# Patient Record
Sex: Female | Born: 1965 | Hispanic: No | Smoking: Never smoker
Health system: Southern US, Community
[De-identification: ages and names within clinical notes are randomized; demographics above are authoritative.]

## PROBLEM LIST (undated history)

## (undated) DIAGNOSIS — M1990 Unspecified osteoarthritis, unspecified site: Secondary | ICD-10-CM

## (undated) DIAGNOSIS — K219 Gastro-esophageal reflux disease without esophagitis: Secondary | ICD-10-CM

## (undated) DIAGNOSIS — Z8489 Family history of other specified conditions: Secondary | ICD-10-CM

## (undated) DIAGNOSIS — I10 Essential (primary) hypertension: Secondary | ICD-10-CM

## (undated) DIAGNOSIS — E119 Type 2 diabetes mellitus without complications: Secondary | ICD-10-CM

## (undated) DIAGNOSIS — G43909 Migraine, unspecified, not intractable, without status migrainosus: Secondary | ICD-10-CM

## (undated) DIAGNOSIS — R0789 Other chest pain: Secondary | ICD-10-CM

## (undated) DIAGNOSIS — S7400XA Injury of sciatic nerve at hip and thigh level, unspecified leg, initial encounter: Secondary | ICD-10-CM

## (undated) DIAGNOSIS — K8020 Calculus of gallbladder without cholecystitis without obstruction: Secondary | ICD-10-CM

## (undated) DIAGNOSIS — T7840XA Allergy, unspecified, initial encounter: Secondary | ICD-10-CM

## (undated) HISTORY — DX: Allergy, unspecified, initial encounter: T78.40XA

## (undated) HISTORY — DX: Type 2 diabetes mellitus without complications: E11.9

## (undated) HISTORY — PX: TONSILLECTOMY: SUR1361

## (undated) HISTORY — DX: Gastro-esophageal reflux disease without esophagitis: K21.9

## (undated) HISTORY — DX: Unspecified osteoarthritis, unspecified site: M19.90

---
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
## 1994-12-26 HISTORY — PX: ANTERIOR CRUCIATE LIGAMENT REPAIR: SHX115

## 1996-12-26 HISTORY — PX: SINOSCOPY: SHX187

## 1996-12-26 HISTORY — PX: KNEE ARTHROSCOPY: SUR90

## 1998-10-20 ENCOUNTER — Encounter: Payer: Self-pay | Admitting: Orthopedic Surgery

## 1998-10-21 ENCOUNTER — Observation Stay (HOSPITAL_COMMUNITY): Admission: AD | Admit: 1998-10-21 | Discharge: 1998-10-22 | Payer: Self-pay | Admitting: Orthopedic Surgery

## 1998-11-03 ENCOUNTER — Other Ambulatory Visit: Admission: RE | Admit: 1998-11-03 | Discharge: 1998-11-03 | Payer: Self-pay | Admitting: Obstetrics and Gynecology

## 1999-11-10 ENCOUNTER — Emergency Department (HOSPITAL_COMMUNITY): Admission: EM | Admit: 1999-11-10 | Discharge: 1999-11-10 | Payer: Self-pay | Admitting: Emergency Medicine

## 1999-11-10 ENCOUNTER — Encounter: Payer: Self-pay | Admitting: Emergency Medicine

## 2000-11-15 ENCOUNTER — Other Ambulatory Visit: Admission: RE | Admit: 2000-11-15 | Discharge: 2000-11-15 | Payer: Self-pay | Admitting: Obstetrics and Gynecology

## 2001-01-07 ENCOUNTER — Emergency Department (HOSPITAL_COMMUNITY): Admission: EM | Admit: 2001-01-07 | Discharge: 2001-01-07 | Payer: Self-pay

## 2001-02-21 ENCOUNTER — Other Ambulatory Visit: Admission: RE | Admit: 2001-02-21 | Discharge: 2001-02-21 | Payer: Self-pay | Admitting: Obstetrics and Gynecology

## 2001-03-08 ENCOUNTER — Ambulatory Visit (HOSPITAL_COMMUNITY): Admission: RE | Admit: 2001-03-08 | Discharge: 2001-03-08 | Payer: Self-pay | Admitting: Obstetrics and Gynecology

## 2001-03-08 ENCOUNTER — Encounter (INDEPENDENT_AMBULATORY_CARE_PROVIDER_SITE_OTHER): Payer: Self-pay

## 2001-04-03 ENCOUNTER — Other Ambulatory Visit: Admission: RE | Admit: 2001-04-03 | Discharge: 2001-04-03 | Payer: Self-pay | Admitting: Obstetrics and Gynecology

## 2001-04-03 ENCOUNTER — Encounter (INDEPENDENT_AMBULATORY_CARE_PROVIDER_SITE_OTHER): Payer: Self-pay | Admitting: Specialist

## 2002-04-21 ENCOUNTER — Emergency Department (HOSPITAL_COMMUNITY): Admission: EM | Admit: 2002-04-21 | Discharge: 2002-04-21 | Payer: Self-pay

## 2002-06-11 ENCOUNTER — Other Ambulatory Visit: Admission: RE | Admit: 2002-06-11 | Discharge: 2002-06-11 | Payer: Self-pay | Admitting: Obstetrics and Gynecology

## 2002-09-16 ENCOUNTER — Ambulatory Visit (HOSPITAL_COMMUNITY): Admission: RE | Admit: 2002-09-16 | Discharge: 2002-09-16 | Payer: Self-pay | Admitting: Obstetrics and Gynecology

## 2002-09-16 ENCOUNTER — Encounter (INDEPENDENT_AMBULATORY_CARE_PROVIDER_SITE_OTHER): Payer: Self-pay | Admitting: Specialist

## 2003-11-05 ENCOUNTER — Other Ambulatory Visit: Admission: RE | Admit: 2003-11-05 | Discharge: 2003-11-05 | Payer: Self-pay | Admitting: Obstetrics and Gynecology

## 2003-12-17 ENCOUNTER — Other Ambulatory Visit: Admission: RE | Admit: 2003-12-17 | Discharge: 2003-12-17 | Payer: Self-pay | Admitting: Obstetrics and Gynecology

## 2008-08-31 ENCOUNTER — Emergency Department (HOSPITAL_COMMUNITY): Admission: EM | Admit: 2008-08-31 | Discharge: 2008-08-31 | Payer: Self-pay | Admitting: Emergency Medicine

## 2008-09-10 ENCOUNTER — Ambulatory Visit (HOSPITAL_COMMUNITY): Admission: RE | Admit: 2008-09-10 | Discharge: 2008-09-10 | Payer: Self-pay | Admitting: Gastroenterology

## 2008-09-30 ENCOUNTER — Encounter: Admission: RE | Admit: 2008-09-30 | Discharge: 2008-09-30 | Payer: Self-pay | Admitting: Gastroenterology

## 2011-05-13 NOTE — Op Note (Signed)
[facility]  Patient:    [name], [name]                        MRN: [id number] Proc. Date: 03/08/01 Adm. Date:  [id number] Disc. Date: [id number] Attending:  Earline Mayotte R                           Operative Report  PREOPERATIVE DIAGNOSIS:       1. Pelvic pain.                               2. Infertility.  POSTOPERATIVE DIAGNOSIS:      1. Pelvic pain.                               2. Infertility.                               3. Probable endometriosis.  OPERATION:                    1. Diagnostic laparoscopy.                               2. Chromopertubation.                               3. Peritoneal biopsies.                               4. Resection of endometriosis.  SURGEON:                      Vanessa P. Pennie Rushing, M.D.  ANESTHESIA:                   General orotracheal.  ESTIMATED BLOOD LOSS:         Less than 50 cc.  COMPLICATIONS:                None.  FINDINGS:                     The uterus, tubes, and ovaries were within normal limits without adhesions.  The tubes were opened bilaterally at chromopertubation.  In the posterior cul-de-sac on the left uterosacral ligament, there was a stellate lesion consistent with endometriosis.  There was a stellate lesion on the right uterosacral ligament and right pelvic sidewall also consistent with endometriosis.  The appendix appeared normal.  DESCRIPTION OF PROCEDURE:     The patient was taken to the operating room after appropriate identification and placed on the operating table.  After the attaining adequate general anesthesia, she was placed in the modified lithotomy position.  The abdomen, perineum, and vagina were prepped with multiple layers of Betadine.  A Foley catheter was inserted into the bladder and connected to straight drainage.  A single-tooth tenaculum was placed on the anterior lip of the cervix and an acorn cannula placed into the cervix. The abdomen was draped as a  sterile field.  The subumbilical and suprapubic regions were infiltrated with a total of 10 cc  of 0.25% Marcaine.  A subumbilical incision was made and a Veress cannula placed through that incision into the peritoneal cavity.  A pneumoperitoneum was created with 4 liters of CO2.  The Veress cannula was removed and a laparoscopic trocar placed through that incision into the perineal cavity.  The laparoscope was placed through the trocar sleeve.  A suprapubic incision was made to the left of midline,  and laparoscopic probe trocar placed through that incision into the peritoneal cavity under direct visualization.  The above-noted findings were made and documented.  Indigo carmine dye was used to inject the endometrial cavity, and free spillage from both fallopian tubes was noted.  The area consistent with endometriosis on the left uterosacral ligament was then biopsy excised with the aid of hydrodissection and removed from the operative field via the laparoscope port.  A similar procedure was carried out with the area consistent with endometriosis on the right pelvic sidewall and uterosacral ligament, and these were all sent as specimens to rule out endometriosis. Copious irrigation was carried out.  Cautery was used to achieve adequate hemostasis, and approximately 100 cc of warm lactated Ringers was left in the peritoneal cavity.  All instruments were then removed from the peritoneal cavity under direct visualization as the CO2 was allowed to escape.  The subumbilical and suprapubic incisions were closed with subcuticular sutures of 3-0 Vicryl. Sterile dressing were applied.  All instruments were removed from the vagina and the Foley catheter removed.  The patient was taken from the operating room to the recovery room in satisfactory condition having tolerated the procedure well with sponge and instrument counts correct.  Specimens to pathology: Peritoneal excisional biopsies to rule  out endometriosis. DD:  03/08/01 TD:  03/08/01 Job: [id number]

## 2011-05-13 NOTE — Op Note (Signed)
   [name], [name]                           ACCOUNT NO.:  [id number]   MEDICAL RECORD NO.:  [id number]                   PATIENT TYPE:  AMB   LOCATION:  [facility]                                  FACILITY:  [facility]   PHYSICIAN:  Hal Morales, M.D.             DATE OF BIRTH:  [birth year]   DATE OF PROCEDURE:  09/16/2002  DATE OF DISCHARGE:                                 OPERATIVE REPORT   PREOPERATIVE DIAGNOSES:  Endocervical high grade squamous intraepithelial  lesion.   POSTOPERATIVE DIAGNOSES:  Endocervical high grade squamous intraepithelial  lesion.   OPERATION:  Cold knife conization of the cervix.   ANESTHESIA:  General.   ESTIMATED BLOOD LOSS:  Less than 50 cc.   COMPLICATIONS:  None.   FINDINGS:  The patient had no significant Lugol's nonstaining areas.  She  had undergone colposcopically directed biopsies in the past with CIN I at 3  o'clock and high grade SIL on endocervical curettings.   PROCEDURE:  The patient was taken to the operating room after appropriate  identification and placed on the operating table.  After the induction of  general anesthesia she was placed in the lithotomy position and the perineum  and vagina were prepped with multiple layers of Betadine and draped as a  sterile field.  A red Robinson catheter was used to empty the bladder.  A  single tooth tenaculum was placed on the anterior cervix outside the  transition zone and the cervix injected with a dilute solution of Pitressin.  The cervix was then painted with Lugol stain and anchor sutures placed at  the 3 and 9 o'clock positions with 0 Vicryl and held.  A cone shaped  specimen including the endocervix was then removed and sent for fresh  evaluation and pathology.  Endocervical curettings were obtained which were  minimal.  A Sturmdorf suture was placed at the 12 and 6 o'clock position and  cautery used to allow for adequate hemostasis.  A gel foam pad was placed in  the conization bed.   Hemostasis was noted to be adequate and all instruments  were removed from the vagina as the patient was awakened from general  anesthesia and taken to the recovery room in satisfactory condition having  tolerated the procedure well with sponge and instrument counts correct.   SPECIMENS:  Conization of the cervix and endocervical curettings.                                               Hal Morales, M.D.    VPH/MEDQ  D:  09/16/2002  T:  09/16/2002  Job:  [id number]

## 2011-05-13 NOTE — H&P (Signed)
[facility]  PatientTERRILYN, [name]                          MRN: [id number] Adm. Date:  03/08/01 Attending:  Erie Noe P. Pennie Summers, M.D.                         History and Physical  DATE OF BIRTH:                August 13, 1966.  HISTORY OF PRESENT ILLNESS:   The patient is a 45 year old black single female, para 1-0-1-1, who presents for evaluation of pelvic pain and infertility.  The patient was initially seen in our office on November 15, 2000 complaining of two years of increasingly severe premenstrual symptomatology including headache, nausea, and back pain two weeks prior to her menses.  She also had not used any birth control over the previous year and had not become pregnant.  She had an episode of low grade fever, severe abdominal pain approximately two weeks subsequent to that and a urinalysis which was suggestive of pyelonephritis.  The patient had had GC and chlamydia cultures on November 15, 2000 which were negative.  Urine culture showed 30,000 colonies of enterococcus.  The patient was treated with Rocephin and had resolution of her symptoms over one to two weeks.  Since that time, the patient has also begun to have some mild menstrual irregularity where previously she had had normal menses.  She has had a normal PSH, a prolactin which was slightly elevated at 30.5, and a progesterone which appeared ovulatory on day 21 at 18.9.  PAST OBSTETRIC HISTORY:       The patient had one full-term pregnancy 17 years ago.  Conceived without difficulty.  GYNECOLOGIC HISTORY:          She underwent menarche at age 86 with monthly menses which consisted of two days of heavy bleeding and three days of tapering and spotting.  She denies intermenstrual bleeding but has had severe cramps that start with the onset of her menses and cause nausea.  She has had some mood swings and headaches prior to her menses.  She is currently sexually active having  had two partners in her life time.  Her last Pap smear done November 15, 2000 showed ASCUS and one was repeated on February 21, 2001.  She has had a history of yeast infections but denies GC, chlamydia, or any sexually transmitted infections.  She is currently using no birth control as she is trying to achieve a pregnancy and has had only used birth control pills in the past with no IUD history.  PAST MEDICAL HISTORY:         Frequent headaches.  PAST SURGICAL HISTORY:        Third molar extraction and knee surgery.  CURRENT MEDICATIONS:          None.  ALLERGIES:                    TYLOX and TORADOL cause itching.  HABITS:                       Diet regular.  Exercise regular.  Monthly self-breast examination.  No alcohol, smoking, or recreational drug use though the patient is exposed to second-hand smoke.  FAMILY HISTORY:  Positive for varicose veins, cancer, hypertension, and migraines.  REVIEW OF SYSTEMS:            Negative except as mentioned above.  PHYSICAL EXAMINATION:  VITAL SIGNS:                  Blood pressure is 120/80.  Weight is 158 pounds.  LUNGS:                        Clear.  HEART:                        Regular rate and rhythm.  ABDOMEN:                      Soft without masses or organomegaly.  EXTREMITIES:                  No clubbing, cyanosis, or edema.  PELVIC:                       EGBUS within normal limits.  The vagina is rugae.  The cervix is without gross lesions.  The uterus feels upper limits of normal size, mobile, and nontender.  Adnexa no masses.  Rectovaginal no masses.  LABORATORY DATA:              Pelvic ultrasound performed on December 06, 2000 showed a normal size uterus with a normal right ovary.  The left ovary was not seen.  IMPRESSION:                   1. Pelvic pain, cannot completely rule out                                  chronic pelvic inflammatory disease with a                                  recent  exacerbation.                               2. Infertility.                               3. Irregular menses.  DISPOSITION:                  A discussion is held with the patient concerning indications for her laparoscopy as well as the risks involved which include but are not limited to anesthesia, bleeding, infection, damage to adjacent organs.  The patient wishes to proceed with diagnostic laparoscopy and possible chromopertubation.  She will have prophylactic antibiotics prior to her procedure. DD:  02/21/01 TD:  02/21/01 Job: [id number]

## 2011-09-28 LAB — CBC
HCT: 37.4
Hemoglobin: 12.6
Platelets: 205
RBC: 4.17
WBC: 9.8

## 2011-09-28 LAB — COMPREHENSIVE METABOLIC PANEL
Albumin: 3.8
Alkaline Phosphatase: 61
BUN: 14
CO2: 28
Chloride: 106
Glucose, Bld: 106 — ABNORMAL HIGH
Potassium: 4
Total Bilirubin: 0.9

## 2011-09-28 LAB — URINALYSIS, ROUTINE W REFLEX MICROSCOPIC
Glucose, UA: NEGATIVE
Hgb urine dipstick: NEGATIVE
Ketones, ur: NEGATIVE
Leukocytes, UA: NEGATIVE
Protein, ur: NEGATIVE

## 2011-09-28 LAB — DIFFERENTIAL
Basophils Absolute: 0.1
Eosinophils Absolute: 0.2
Lymphocytes Relative: 33
Neutro Abs: 5.4

## 2014-02-24 ENCOUNTER — Other Ambulatory Visit: Payer: Self-pay | Admitting: Internal Medicine

## 2014-02-24 DIAGNOSIS — R1013 Epigastric pain: Secondary | ICD-10-CM

## 2014-02-27 ENCOUNTER — Ambulatory Visit
Admission: RE | Admit: 2014-02-27 | Discharge: 2014-02-27 | Disposition: A | Discharge status: Home / Self Care | Payer: BC Managed Care – PPO | Source: Ambulatory Visit | Attending: Internal Medicine | Admitting: Internal Medicine

## 2014-02-27 DIAGNOSIS — R1013 Epigastric pain: Secondary | ICD-10-CM

## 2014-08-30 ENCOUNTER — Encounter (HOSPITAL_COMMUNITY): Payer: Self-pay | Admitting: Emergency Medicine

## 2014-08-30 DIAGNOSIS — Z885 Allergy status to narcotic agent status: Secondary | ICD-10-CM | POA: Diagnosis not present

## 2014-08-30 DIAGNOSIS — IMO0002 Reserved for concepts with insufficient information to code with codable children: Secondary | ICD-10-CM | POA: Diagnosis not present

## 2014-08-30 DIAGNOSIS — K8010 Calculus of gallbladder with chronic cholecystitis without obstruction: Secondary | ICD-10-CM | POA: Diagnosis not present

## 2014-08-30 DIAGNOSIS — K219 Gastro-esophageal reflux disease without esophagitis: Secondary | ICD-10-CM | POA: Insufficient documentation

## 2014-08-30 DIAGNOSIS — J301 Allergic rhinitis due to pollen: Secondary | ICD-10-CM | POA: Diagnosis not present

## 2014-08-30 DIAGNOSIS — Z91018 Allergy to other foods: Secondary | ICD-10-CM | POA: Insufficient documentation

## 2014-08-30 DIAGNOSIS — K7689 Other specified diseases of liver: Secondary | ICD-10-CM | POA: Insufficient documentation

## 2014-08-30 DIAGNOSIS — Z79899 Other long term (current) drug therapy: Secondary | ICD-10-CM | POA: Diagnosis not present

## 2014-08-30 DIAGNOSIS — Z880 Allergy status to penicillin: Secondary | ICD-10-CM | POA: Insufficient documentation

## 2014-08-30 DIAGNOSIS — K660 Peritoneal adhesions (postprocedural) (postinfection): Secondary | ICD-10-CM | POA: Diagnosis not present

## 2014-08-30 DIAGNOSIS — R1011 Right upper quadrant pain: Secondary | ICD-10-CM | POA: Diagnosis present

## 2014-08-30 LAB — COMPREHENSIVE METABOLIC PANEL
ALBUMIN: 4.3 g/dL (ref 3.5–5.2)
ALK PHOS: 197 U/L — AB (ref 39–117)
ALT: 530 U/L — ABNORMAL HIGH (ref 0–35)
ANION GAP: 18 — AB (ref 5–15)
AST: 561 U/L — AB (ref 0–37)
BILIRUBIN TOTAL: 1 mg/dL (ref 0.3–1.2)
BUN: 11 mg/dL (ref 6–23)
CHLORIDE: 95 meq/L — AB (ref 96–112)
CO2: 23 mEq/L (ref 19–32)
Calcium: 9.9 mg/dL (ref 8.4–10.5)
Creatinine, Ser: 0.92 mg/dL (ref 0.50–1.10)
GFR calc Af Amer: 83 mL/min — ABNORMAL LOW (ref >90)
GFR calc non Af Amer: 72 mL/min — ABNORMAL LOW (ref >90)
Glucose, Bld: 135 mg/dL — ABNORMAL HIGH (ref 70–99)
POTASSIUM: 3.9 meq/L (ref 3.7–5.3)
SODIUM: 136 meq/L — AB (ref 137–147)
TOTAL PROTEIN: 8.4 g/dL — AB (ref 6.0–8.3)

## 2014-08-30 LAB — URINALYSIS, ROUTINE W REFLEX MICROSCOPIC
Bilirubin Urine: NEGATIVE
GLUCOSE, UA: NEGATIVE mg/dL
Hgb urine dipstick: NEGATIVE
Ketones, ur: NEGATIVE mg/dL
LEUKOCYTES UA: NEGATIVE
Nitrite: NEGATIVE
PROTEIN: NEGATIVE mg/dL
SPECIFIC GRAVITY, URINE: 1.007 (ref 1.005–1.030)
UROBILINOGEN UA: 0.2 mg/dL (ref 0.0–1.0)
pH: 6 (ref 5.0–8.0)

## 2014-08-30 LAB — POC URINE PREG, ED: Preg Test, Ur: NEGATIVE

## 2014-08-30 LAB — LIPASE, BLOOD: Lipase: 24 U/L (ref 11–59)

## 2014-08-30 NOTE — ED Notes (Signed)
Patient presents stating she has been diagnosed with gallstones.  Has been having N/V today

## 2014-08-31 ENCOUNTER — Emergency Department (HOSPITAL_COMMUNITY): Payer: BC Managed Care – PPO

## 2014-08-31 ENCOUNTER — Observation Stay (HOSPITAL_COMMUNITY): Payer: BC Managed Care – PPO

## 2014-08-31 ENCOUNTER — Encounter (HOSPITAL_COMMUNITY): Payer: BC Managed Care – PPO | Admitting: Anesthesiology

## 2014-08-31 ENCOUNTER — Observation Stay (HOSPITAL_COMMUNITY)
Admission: EM | Admit: 2014-08-31 | Discharge: 2014-09-04 | Disposition: A | Discharge status: Home / Self Care | Payer: BC Managed Care – PPO | Attending: General Surgery | Admitting: General Surgery

## 2014-08-31 ENCOUNTER — Encounter (HOSPITAL_COMMUNITY)
Admission: EM | Disposition: A | Discharge status: Home / Self Care | Payer: Self-pay | Source: Home / Self Care | Attending: Emergency Medicine

## 2014-08-31 ENCOUNTER — Observation Stay (HOSPITAL_COMMUNITY): Payer: BC Managed Care – PPO | Admitting: Anesthesiology

## 2014-08-31 DIAGNOSIS — K8000 Calculus of gallbladder with acute cholecystitis without obstruction: Secondary | ICD-10-CM | POA: Diagnosis present

## 2014-08-31 DIAGNOSIS — K8001 Calculus of gallbladder with acute cholecystitis with obstruction: Secondary | ICD-10-CM

## 2014-08-31 HISTORY — PX: CHOLECYSTECTOMY: SHX55

## 2014-08-31 HISTORY — DX: Family history of other specified conditions: Z84.89

## 2014-08-31 HISTORY — DX: Migraine, unspecified, not intractable, without status migrainosus: G43.909

## 2014-08-31 HISTORY — PX: LAPAROSCOPIC CHOLECYSTECTOMY: SUR755

## 2014-08-31 HISTORY — DX: Calculus of gallbladder without cholecystitis without obstruction: K80.20

## 2014-08-31 HISTORY — DX: Injury of sciatic nerve at hip and thigh level, unspecified leg, initial encounter: S74.00XA

## 2014-08-31 LAB — CBC WITH DIFFERENTIAL/PLATELET
BASOS ABS: 0 10*3/uL (ref 0.0–0.1)
Basophils Relative: 0 % (ref 0–1)
EOS ABS: 0 10*3/uL (ref 0.0–0.7)
Eosinophils Relative: 0 % (ref 0–5)
HCT: 43 % (ref 36.0–46.0)
HEMOGLOBIN: 15.1 g/dL — AB (ref 12.0–15.0)
LYMPHS PCT: 8 % — AB (ref 12–46)
Lymphs Abs: 0.9 10*3/uL (ref 0.7–4.0)
MCH: 29.8 pg (ref 26.0–34.0)
MCHC: 35.1 g/dL (ref 30.0–36.0)
MCV: 85 fL (ref 78.0–100.0)
MONO ABS: 0.7 10*3/uL (ref 0.1–1.0)
Monocytes Relative: 6 % (ref 3–12)
NEUTROS PCT: 86 % — AB (ref 43–77)
Neutro Abs: 9.9 10*3/uL — ABNORMAL HIGH (ref 1.7–7.7)
PLATELETS: ADEQUATE 10*3/uL (ref 150–400)
RBC: 5.06 MIL/uL (ref 3.87–5.11)
RDW: 14.3 % (ref 11.5–15.5)
WBC: 11.5 10*3/uL — ABNORMAL HIGH (ref 4.0–10.5)

## 2014-08-31 LAB — CBC
HEMATOCRIT: 37.5 % (ref 36.0–46.0)
HEMOGLOBIN: 12.9 g/dL (ref 12.0–15.0)
MCH: 29.3 pg (ref 26.0–34.0)
MCHC: 34.4 g/dL (ref 30.0–36.0)
MCV: 85.2 fL (ref 78.0–100.0)
Platelets: 201 10*3/uL (ref 150–400)
RBC: 4.4 MIL/uL (ref 3.87–5.11)
RDW: 14.6 % (ref 11.5–15.5)
WBC: 10 10*3/uL (ref 4.0–10.5)

## 2014-08-31 LAB — CREATININE, SERUM
Creatinine, Ser: 0.91 mg/dL (ref 0.50–1.10)
GFR calc Af Amer: 84 mL/min — ABNORMAL LOW (ref >90)
GFR, EST NON AFRICAN AMERICAN: 73 mL/min — AB (ref >90)

## 2014-08-31 LAB — SURGICAL PCR SCREEN
MRSA, PCR: NEGATIVE
STAPHYLOCOCCUS AUREUS: POSITIVE — AB

## 2014-08-31 SURGERY — LAPAROSCOPIC CHOLECYSTECTOMY WITH INTRAOPERATIVE CHOLANGIOGRAM
Anesthesia: General | Site: Abdomen

## 2014-08-31 MED ORDER — DEXAMETHASONE SODIUM PHOSPHATE 4 MG/ML IJ SOLN
INTRAMUSCULAR | Status: DC | PRN
  Administered 2014-08-31: 4 mg via INTRAVENOUS

## 2014-08-31 MED ORDER — DEXAMETHASONE SODIUM PHOSPHATE 4 MG/ML IJ SOLN
INTRAMUSCULAR | Status: AC
  Filled 2014-08-31: qty 1

## 2014-08-31 MED ORDER — CIPROFLOXACIN IN D5W 400 MG/200ML IV SOLN
400.0000 mg | Freq: Two times a day (BID) | INTRAVENOUS | Status: DC
  Administered 2014-08-31: 400 mg via INTRAVENOUS
  Filled 2014-08-31 (×2): qty 200

## 2014-08-31 MED ORDER — FENTANYL CITRATE 0.05 MG/ML IJ SOLN
INTRAMUSCULAR | Status: AC
  Filled 2014-08-31: qty 5

## 2014-08-31 MED ORDER — FENTANYL CITRATE 0.05 MG/ML IJ SOLN
50.0000 ug | Freq: Once | INTRAMUSCULAR | Status: AC
  Administered 2014-08-31: 50 ug via INTRAVENOUS
  Filled 2014-08-31: qty 2

## 2014-08-31 MED ORDER — HYDROMORPHONE HCL PF 1 MG/ML IJ SOLN: 1.0000 mg | INTRAMUSCULAR | Status: DC | PRN

## 2014-08-31 MED ORDER — LIDOCAINE HCL (CARDIAC) 20 MG/ML IV SOLN
INTRAVENOUS | Status: AC
  Filled 2014-08-31: qty 5

## 2014-08-31 MED ORDER — OXYCODONE HCL 5 MG PO TABS: 5.0000 mg | ORAL_TABLET | Freq: Once | ORAL | Status: DC | PRN

## 2014-08-31 MED ORDER — GLYCOPYRROLATE 0.2 MG/ML IJ SOLN
INTRAMUSCULAR | Status: AC
  Filled 2014-08-31: qty 2

## 2014-08-31 MED ORDER — ONDANSETRON HCL 4 MG/2ML IJ SOLN
4.0000 mg | Freq: Four times a day (QID) | INTRAMUSCULAR | Status: DC | PRN
  Administered 2014-08-31: 4 mg via INTRAVENOUS
  Filled 2014-08-31: qty 2

## 2014-08-31 MED ORDER — ONDANSETRON HCL 4 MG/2ML IJ SOLN: 4.0000 mg | Freq: Once | INTRAMUSCULAR | Status: DC | PRN

## 2014-08-31 MED ORDER — ONDANSETRON HCL 4 MG/2ML IJ SOLN
INTRAMUSCULAR | Status: DC | PRN
  Administered 2014-08-31: 4 mg via INTRAVENOUS

## 2014-08-31 MED ORDER — ONDANSETRON HCL 4 MG/2ML IJ SOLN
4.0000 mg | Freq: Once | INTRAMUSCULAR | Status: AC
  Administered 2014-08-31: 4 mg via INTRAVENOUS
  Filled 2014-08-31: qty 2

## 2014-08-31 MED ORDER — OXYCODONE-ACETAMINOPHEN 5-325 MG PO TABS
ORAL_TABLET | ORAL | Status: AC
  Administered 2014-08-31: 2 via ORAL
  Filled 2014-08-31: qty 2

## 2014-08-31 MED ORDER — SUCCINYLCHOLINE CHLORIDE 20 MG/ML IJ SOLN
INTRAMUSCULAR | Status: AC
  Filled 2014-08-31: qty 1

## 2014-08-31 MED ORDER — IOHEXOL 300 MG/ML  SOLN
INTRAMUSCULAR | Status: DC | PRN
  Administered 2014-08-31 (×2)

## 2014-08-31 MED ORDER — VECURONIUM BROMIDE 10 MG IV SOLR
INTRAVENOUS | Status: DC | PRN
  Administered 2014-08-31: 3 mg via INTRAVENOUS

## 2014-08-31 MED ORDER — LIDOCAINE HCL (CARDIAC) 20 MG/ML IV SOLN
INTRAVENOUS | Status: DC | PRN
  Administered 2014-08-31: 100 mg via INTRAVENOUS

## 2014-08-31 MED ORDER — LACTATED RINGERS IV SOLN
INTRAVENOUS | Status: DC | PRN
  Administered 2014-08-31 (×2): via INTRAVENOUS

## 2014-08-31 MED ORDER — MIDAZOLAM HCL 2 MG/2ML IJ SOLN
INTRAMUSCULAR | Status: DC | PRN
  Administered 2014-08-31: 2 mg via INTRAVENOUS

## 2014-08-31 MED ORDER — OXYCODONE HCL 5 MG/5ML PO SOLN: 5.0000 mg | Freq: Once | ORAL | Status: DC | PRN

## 2014-08-31 MED ORDER — KCL IN DEXTROSE-NACL 20-5-0.45 MEQ/L-%-% IV SOLN
INTRAVENOUS | Status: DC
  Administered 2014-08-31 – 2014-09-01 (×2): via INTRAVENOUS
  Filled 2014-08-31 (×6): qty 1000

## 2014-08-31 MED ORDER — LABETALOL HCL 5 MG/ML IV SOLN
INTRAVENOUS | Status: AC
Start: 2014-08-31 — End: 2014-08-31
  Filled 2014-08-31: qty 4

## 2014-08-31 MED ORDER — FENTANYL CITRATE 0.05 MG/ML IJ SOLN
INTRAMUSCULAR | Status: DC | PRN
  Administered 2014-08-31: 150 ug via INTRAVENOUS
  Administered 2014-08-31 (×2): 50 ug via INTRAVENOUS

## 2014-08-31 MED ORDER — PROPOFOL 10 MG/ML IV BOLUS
INTRAVENOUS | Status: DC | PRN
  Administered 2014-08-31 (×2): 20 mg via INTRAVENOUS
  Administered 2014-08-31: 140 mg via INTRAVENOUS

## 2014-08-31 MED ORDER — PANTOPRAZOLE SODIUM 40 MG IV SOLR
40.0000 mg | Freq: Every day | INTRAVENOUS | Status: DC
Start: 2014-08-31 — End: 2014-09-01
  Administered 2014-08-31: 40 mg via INTRAVENOUS
  Filled 2014-08-31 (×2): qty 40

## 2014-08-31 MED ORDER — SUCCINYLCHOLINE CHLORIDE 20 MG/ML IJ SOLN
INTRAMUSCULAR | Status: DC | PRN
  Administered 2014-08-31: 120 mg via INTRAVENOUS

## 2014-08-31 MED ORDER — SODIUM CHLORIDE 0.9 % IJ SOLN
INTRAMUSCULAR | Status: AC
  Filled 2014-08-31: qty 10

## 2014-08-31 MED ORDER — LABETALOL HCL 5 MG/ML IV SOLN
INTRAVENOUS | Status: DC | PRN
  Administered 2014-08-31: 10 mg via INTRAVENOUS
  Administered 2014-08-31: 5 mg via INTRAVENOUS

## 2014-08-31 MED ORDER — MIDAZOLAM HCL 2 MG/2ML IJ SOLN
INTRAMUSCULAR | Status: AC
  Filled 2014-08-31: qty 2

## 2014-08-31 MED ORDER — HYDROMORPHONE HCL PF 1 MG/ML IJ SOLN
0.2500 mg | INTRAMUSCULAR | Status: DC | PRN
  Administered 2014-08-31 (×2): 0.5 mg via INTRAVENOUS

## 2014-08-31 MED ORDER — HYDROMORPHONE HCL PF 1 MG/ML IJ SOLN
1.0000 mg | INTRAMUSCULAR | Status: DC | PRN
  Administered 2014-08-31 – 2014-09-01 (×4): 1 mg via INTRAVENOUS
  Administered 2014-09-01: 2 mg via INTRAVENOUS
  Filled 2014-08-31 (×3): qty 1
  Filled 2014-08-31: qty 2
  Filled 2014-08-31: qty 1

## 2014-08-31 MED ORDER — VECURONIUM BROMIDE 10 MG IV SOLR
INTRAVENOUS | Status: AC
  Filled 2014-08-31: qty 10

## 2014-08-31 MED ORDER — HYDROMORPHONE HCL PF 1 MG/ML IJ SOLN
INTRAMUSCULAR | Status: AC
  Administered 2014-08-31: 0.5 mg via INTRAVENOUS
  Filled 2014-08-31: qty 1

## 2014-08-31 MED ORDER — GLYCOPYRROLATE 0.2 MG/ML IJ SOLN
INTRAMUSCULAR | Status: DC | PRN
  Administered 2014-08-31: 0.4 mg via INTRAVENOUS

## 2014-08-31 MED ORDER — SCOPOLAMINE 1 MG/3DAYS TD PT72
MEDICATED_PATCH | TRANSDERMAL | Status: AC
  Filled 2014-08-31: qty 1

## 2014-08-31 MED ORDER — MEPERIDINE HCL 25 MG/ML IJ SOLN: 6.2500 mg | INTRAMUSCULAR | Status: DC | PRN

## 2014-08-31 MED ORDER — BUPIVACAINE-EPINEPHRINE 0.25% -1:200000 IJ SOLN
INTRAMUSCULAR | Status: DC | PRN
  Administered 2014-08-31: 22 mL

## 2014-08-31 MED ORDER — SCOPOLAMINE 1 MG/3DAYS TD PT72
MEDICATED_PATCH | TRANSDERMAL | Status: AC
  Administered 2014-08-31: 1 via TRANSDERMAL
  Filled 2014-08-31: qty 1

## 2014-08-31 MED ORDER — ENOXAPARIN SODIUM 40 MG/0.4ML ~~LOC~~ SOLN: 40.0000 mg | Freq: Every day | SUBCUTANEOUS | Status: DC

## 2014-08-31 MED ORDER — ENOXAPARIN SODIUM 40 MG/0.4ML ~~LOC~~ SOLN
40.0000 mg | SUBCUTANEOUS | Status: DC
  Administered 2014-09-01 – 2014-09-04 (×4): 40 mg via SUBCUTANEOUS
  Filled 2014-08-31 (×5): qty 0.4

## 2014-08-31 MED ORDER — BUPIVACAINE-EPINEPHRINE (PF) 0.25% -1:200000 IJ SOLN
INTRAMUSCULAR | Status: AC
  Filled 2014-08-31: qty 30

## 2014-08-31 MED ORDER — PROPOFOL 10 MG/ML IV BOLUS
INTRAVENOUS | Status: AC
  Filled 2014-08-31: qty 20

## 2014-08-31 MED ORDER — DIPHENHYDRAMINE HCL 50 MG/ML IJ SOLN
12.5000 mg | Freq: Three times a day (TID) | INTRAMUSCULAR | Status: DC | PRN
  Administered 2014-08-31 – 2014-09-01 (×4): 25 mg via INTRAVENOUS
  Filled 2014-08-31 (×4): qty 1

## 2014-08-31 MED ORDER — OXYCODONE-ACETAMINOPHEN 5-325 MG PO TABS
1.0000 | ORAL_TABLET | ORAL | Status: DC | PRN
  Administered 2014-08-31: 2 via ORAL

## 2014-08-31 MED ORDER — 0.9 % SODIUM CHLORIDE (POUR BTL) OPTIME
TOPICAL | Status: DC | PRN
  Administered 2014-08-31: 1000 mL

## 2014-08-31 MED ORDER — SODIUM CHLORIDE 0.9 % IR SOLN
Status: DC | PRN
  Administered 2014-08-31: 1000 mL

## 2014-08-31 MED ORDER — SODIUM CHLORIDE 0.9 % IV BOLUS (SEPSIS)
1000.0000 mL | Freq: Once | INTRAVENOUS | Status: AC
  Administered 2014-08-31: 1000 mL via INTRAVENOUS

## 2014-08-31 MED ORDER — ONDANSETRON HCL 4 MG/2ML IJ SOLN
INTRAMUSCULAR | Status: AC
  Filled 2014-08-31: qty 2

## 2014-08-31 MED ORDER — NEOSTIGMINE METHYLSULFATE 10 MG/10ML IV SOLN
INTRAVENOUS | Status: DC | PRN
  Administered 2014-08-31: 2 mg via INTRAVENOUS

## 2014-08-31 SURGICAL SUPPLY — 45 items
APPLIER CLIP 5 13 M/L LIGAMAX5 (MISCELLANEOUS) ×2
BANDAGE ADH SHEER 1  50/CT (GAUZE/BANDAGES/DRESSINGS) ×6 IMPLANT
BENZOIN TINCTURE PRP APPL 2/3 (GAUZE/BANDAGES/DRESSINGS) ×2 IMPLANT
BLADE SURG ROTATE 9660 (MISCELLANEOUS) IMPLANT
CANISTER SUCTION 2500CC (MISCELLANEOUS) ×2 IMPLANT
CHLORAPREP W/TINT 26ML (MISCELLANEOUS) ×2 IMPLANT
CLIP APPLIE 5 13 M/L LIGAMAX5 (MISCELLANEOUS) ×1 IMPLANT
COVER MAYO STAND STRL (DRAPES) ×2 IMPLANT
COVER SURGICAL LIGHT HANDLE (MISCELLANEOUS) ×2 IMPLANT
DECANTER SPIKE VIAL GLASS SM (MISCELLANEOUS) ×2 IMPLANT
DRAPE C-ARM 42X72 X-RAY (DRAPES) ×2 IMPLANT
DRAPE UTILITY 15X26 W/TAPE STR (DRAPE) ×4 IMPLANT
DRSG TEGADERM 4X4.75 (GAUZE/BANDAGES/DRESSINGS) ×2 IMPLANT
ELECT REM PT RETURN 9FT ADLT (ELECTROSURGICAL) ×2
ELECTRODE REM PT RTRN 9FT ADLT (ELECTROSURGICAL) ×1 IMPLANT
GAUZE SPONGE 2X2 8PLY STRL LF (GAUZE/BANDAGES/DRESSINGS) ×1 IMPLANT
GLOVE BIOGEL M STRL SZ7.5 (GLOVE) ×2 IMPLANT
GLOVE BIOGEL PI IND STRL 7.0 (GLOVE) ×4 IMPLANT
GLOVE BIOGEL PI IND STRL 8 (GLOVE) ×1 IMPLANT
GLOVE BIOGEL PI INDICATOR 7.0 (GLOVE) ×4
GLOVE BIOGEL PI INDICATOR 8 (GLOVE) ×1
GLOVE ECLIPSE 6.5 STRL STRAW (GLOVE) ×2 IMPLANT
GLOVE SS BIOGEL STRL SZ 6.5 (GLOVE) ×1 IMPLANT
GLOVE SUPERSENSE BIOGEL SZ 6.5 (GLOVE) ×1
GOWN STRL REUS W/ TWL LRG LVL3 (GOWN DISPOSABLE) ×3 IMPLANT
GOWN STRL REUS W/ TWL XL LVL3 (GOWN DISPOSABLE) ×1 IMPLANT
GOWN STRL REUS W/TWL LRG LVL3 (GOWN DISPOSABLE) ×3
GOWN STRL REUS W/TWL XL LVL3 (GOWN DISPOSABLE) ×1
KIT BASIN OR (CUSTOM PROCEDURE TRAY) ×2 IMPLANT
KIT ROOM TURNOVER OR (KITS) ×2 IMPLANT
NS IRRIG 1000ML POUR BTL (IV SOLUTION) ×2 IMPLANT
PAD ARMBOARD 7.5X6 YLW CONV (MISCELLANEOUS) ×2 IMPLANT
POUCH SPECIMEN RETRIEVAL 10MM (ENDOMECHANICALS) ×2 IMPLANT
SCISSORS LAP 5X35 DISP (ENDOMECHANICALS) ×2 IMPLANT
SET CHOLANGIOGRAPH 5 50 .035 (SET/KITS/TRAYS/PACK) ×2 IMPLANT
SET IRRIG TUBING LAPAROSCOPIC (IRRIGATION / IRRIGATOR) ×2 IMPLANT
SLEEVE ENDOPATH XCEL 5M (ENDOMECHANICALS) ×4 IMPLANT
SPECIMEN JAR SMALL (MISCELLANEOUS) ×2 IMPLANT
SPONGE GAUZE 2X2 STER 10/PKG (GAUZE/BANDAGES/DRESSINGS) ×1
SUT MNCRL AB 4-0 PS2 18 (SUTURE) ×2 IMPLANT
TOWEL OR 17X24 6PK STRL BLUE (TOWEL DISPOSABLE) ×2 IMPLANT
TOWEL OR 17X26 10 PK STRL BLUE (TOWEL DISPOSABLE) ×2 IMPLANT
TRAY LAPAROSCOPIC (CUSTOM PROCEDURE TRAY) ×2 IMPLANT
TROCAR XCEL BLUNT TIP 100MML (ENDOMECHANICALS) ×2 IMPLANT
TROCAR XCEL NON-BLD 5MMX100MML (ENDOMECHANICALS) ×2 IMPLANT

## 2014-08-31 NOTE — ED Provider Notes (Signed)
CSN: [id number]     Arrival date & time 08/30/14  2157 History   First MD Initiated Contact with Patient 08/31/14 [phone]     Chief Complaint  Patient presents with  . Nausea  . Emesis     (Consider location/radiation/quality/duration/timing/severity/associated sxs/prior Treatment) HPI Patient has a history of known gallstones confirmed by ultrasound. She presents with upper abdominal pain and nausea starting at 7 AM on 9/5. This is been constant throughout the day. She began vomiting at 5 PM and states she's vomited roughly 5 times. She's had subjective fevers and chills. Denies any diarrhea. No sick contacts. The pain radiates to her right thoracic region. No shortness of breath or chest pain. Past Medical History  Diagnosis Date  . Gallstones    History reviewed. No pertinent past surgical history. History reviewed. No pertinent family history. History  Substance Use Topics  . Smoking status: Never Smoker   . Smokeless tobacco: Never Used  . Alcohol Use: No   OB History   Grav Para Term Preterm Abortions TAB SAB Ect Mult Living                 Review of Systems  Constitutional: Positive for fever and chills.  Respiratory: Negative for cough and shortness of breath.   Cardiovascular: Negative for chest pain.  Gastrointestinal: Positive for nausea, vomiting and abdominal pain. Negative for diarrhea.  Genitourinary: Negative for dysuria, frequency and difficulty urinating.  Musculoskeletal: Positive for back pain. Negative for myalgias, neck pain and neck stiffness.  Skin: Negative for rash and wound.  Neurological: Negative for dizziness, weakness, light-headedness, numbness and headaches.  All other systems reviewed and are negative.     Allergies  Citrus; Penicillins; and Vicodin  Home Medications   Prior to Admission medications   Medication Sig Start Date End Date Taking? Authorizing Provider  fluticasone (FLONASE) 50 MCG/ACT nasal spray Place 2 sprays into both  nostrils daily.   Yes Historical Provider, MD  omeprazole (PRILOSEC) 40 MG capsule Take 40 mg by mouth daily.   Yes Historical Provider, MD  SUMAtriptan (IMITREX) 100 MG tablet Take 100 mg by mouth every 2 (two) hours as needed for migraine or headache. May repeat in 2 hours if headache persists or recurs.   Yes Historical Provider, MD  traMADol (ULTRAM) 50 MG tablet Take 50 mg by mouth every 6 (six) hours as needed for moderate pain.   Yes Historical Provider, MD   BP 106/59  Pulse 96  Temp(Src) 99.7 F (37.6 C) (Oral)  Resp 23  Ht  (1.499 m)  Wt 179 lb (81.194 kg)  BMI 36.13 kg/m2  SpO2 93% Physical Exam  Nursing note and vitals reviewed. Constitutional: She is oriented to person, place, and time. She appears well-developed and well-nourished. No distress.  HENT:  Head: Normocephalic and atraumatic.  Mouth/Throat: Oropharynx is clear and moist.  Eyes: EOM are normal. Pupils are equal, round, and reactive to light.  Neck: Normal range of motion. Neck supple.  Cardiovascular: Normal rate and regular rhythm.   Pulmonary/Chest: Effort normal and breath sounds normal. No respiratory distress. She has no wheezes. She has no rales. She exhibits no tenderness.  Abdominal: Soft. Bowel sounds are normal. She exhibits no distension and no mass. There is tenderness (tenderness to palpation in the epigastric and right upper quadrant. No rebound or guarding.). There is no rebound and no guarding.  Musculoskeletal: Normal range of motion. She exhibits no edema and no tenderness.  No CVA tenderness bilaterally.  Neurological: She is alert and oriented to person, place, and time.  Skin: Skin is warm and dry. No rash noted. No erythema.  Psychiatric: She has a normal mood and affect. Her behavior is normal.    ED Course  Procedures (including critical care time) Labs Review Labs Reviewed  CBC WITH DIFFERENTIAL - Abnormal; Notable for the following:    WBC 11.5 (*)    Hemoglobin 15.1 (*)     Neutrophils Relative % 86 (*)    Lymphocytes Relative 8 (*)    Neutro Abs 9.9 (*)    All other components within normal limits  COMPREHENSIVE METABOLIC PANEL - Abnormal; Notable for the following:    Sodium 136 (*)    Chloride 95 (*)    Glucose, Bld 135 (*)    Total Protein 8.4 (*)    AST 561 (*)    ALT 530 (*)    Alkaline Phosphatase 197 (*)    GFR calc non Af Amer 72 (*)    GFR calc Af Amer 83 (*)    Anion gap 18 (*)    All other components within normal limits  URINALYSIS, ROUTINE W REFLEX MICROSCOPIC - Abnormal; Notable for the following:    APPearance CLOUDY (*)    All other components within normal limits  LIPASE, BLOOD  POC URINE PREG, ED    Imaging Review US Abdomen Complete  08/31/2014   CLINICAL DATA:  Nausea and vomiting with generalized abdominal pain.  EXAM: ULTRASOUND ABDOMEN COMPLETE  COMPARISON:  02/27/2014  FINDINGS: Gallbladder:  Multiple stones and sludge layering in the dependent portion of the gallbladder. No gallbladder wall thickening. Murphy's sign is negative.  Common bile duct:  Diameter: 4.5 mm, normal  Liver:  Heterogeneous liver parenchymal echotexture most suggestive of fatty infiltration. No focal lesions identified.  IVC:  No abnormality visualized.  Pancreas:  Not visualized due to overlying bowel gas.  Spleen:  Size and appearance within normal limits.  Right Kidney:  Length: 10.6 cm. Echogenicity within normal limits. No mass or hydronephrosis visualized.  Left Kidney:  Length: 10.4 cm. Echogenicity within normal limits. No mass or hydronephrosis visualized.  Abdominal aorta:  No aneurysm visualized.  Other findings:  None.  IMPRESSION: Stones and sludge in the gallbladder. No associated inflammatory changes. Diffuse fatty infiltration of the liver.   Electronically Signed   By: Burman Nieves M.D.   On: 08/31/2014 03:06     EKG Interpretation None      Date: 08/31/2014  Rate: 90  Rhythm: normal sinus rhythm  QRS Axis: normal  Intervals:  normal  ST/T Wave abnormalities: normal  Conduction Disutrbances:none  Narrative Interpretation:   Old EKG Reviewed: unchanged   MDM   Final diagnoses:  None    Likely cholecystitis. We'll keep n.p.o., treat symptomatically and confirmed with ultrasound.  Discussed with Dr. Corliss Skains. Will see patient in emergency department.  Loren Racer, MD 09/01/14 [phone]

## 2014-08-31 NOTE — Interval H&P Note (Signed)
History and Physical Interval Note:  08/31/2014 8:41 AM  [name]  has presented today for surgery, with the diagnosis of Acute cholecystitis  The various methods of treatment have been discussed with the patient and family. After consideration of risks, benefits and other options for treatment, the patient has consented to  Procedure(s): LAPAROSCOPIC CHOLECYSTECTOMY WITH INTRAOPERATIVE CHOLANGIOGRAM (N/A) as a surgical intervention .  The patient's history has been reviewed, patient examined, no change in status, stable for surgery.  I have reviewed the patient's chart and labs.  Questions were answered to the patient's satisfaction.    I believe the patient's symptoms are consistent with gallbladder disease.  We discussed gallbladder disease.   I discussed laparoscopic cholecystectomy with IOC in detail.  The patient was shown diagrams detailing the procedure.  We discussed the risks and benefits of a laparoscopic cholecystectomy including, but not limited to bleeding, infection, injury to surrounding structures such as the intestine or liver, bile leak, retained gallstones, need to convert to an open procedure, prolonged diarrhea, blood clots such as  DVT, common bile duct injury, anesthesia risks, and possible need for additional procedures.  We discussed the typical post-operative recovery course. I explained that the likelihood of improvement of their symptoms is good.  [name]. Andrey Campanile, MD, FACS General, Bariatric, & Minimally Invasive Surgery [facility], [place]    [facility] M

## 2014-08-31 NOTE — Op Note (Signed)
[name] [id number] Aug 11, 1966 08/31/2014  Laparoscopic Cholecystectomy with IOC Procedure Note  Indications: This patient presents with symptomatic gallbladder disease and will undergo laparoscopic cholecystectomy.  Pre-operative Diagnosis: Calculus of gallbladder with other cholecystitis, without mention of obstruction; fatty liver  Post-operative Diagnosis: Same  Surgeon: Atilano Ina   Assistants: none  Anesthesia: General endotracheal anesthesia  ASA Class: 2  Procedure Details  The patient was seen again in the Holding Room. The risks, benefits, complications, treatment options, and expected outcomes were discussed with the patient. The possibilities of reaction to medication, pulmonary aspiration, perforation of viscus, bleeding, recurrent infection, finding a normal gallbladder, the need for additional procedures, failure to diagnose a condition, the possible need to convert to an open procedure, and creating a complication requiring transfusion or operation were discussed with the patient. The likelihood of improving the patient's symptoms with return to their baseline status is good.  The patient and/or family concurred with the proposed plan, giving informed consent. The site of surgery properly noted. The patient was taken to Operating Room, identified as [name] and the procedure verified as Laparoscopic Cholecystectomy with Intraoperative Cholangiogram. A Time Out was held and the above information confirmed. Antibiotic prophylaxis was administered.   Prior to the induction of general anesthesia, antibiotic prophylaxis was administered. General endotracheal anesthesia was then administered and tolerated well. After the induction, the abdomen was prepped with Chloraprep and draped in the sterile fashion. The patient was positioned in the supine position.  Local anesthetic agent was injected into the skin near the umbilicus and an incision made. We dissected down to the  abdominal fascia with blunt dissection.  The fascia was incised vertically and we entered the peritoneal cavity bluntly.  A pursestring suture of 0-Vicryl was placed around the fascial opening.  The Hasson cannula was inserted and secured with the stay suture.  Pneumoperitoneum was then created with CO2 and tolerated well without any adverse changes in the patient's vital signs. An 5-mm port was placed in the subxiphoid position.  Two 5-mm ports were placed in the right upper quadrant. All skin incisions were infiltrated with a local anesthetic agent before making the incision and placing the trocars.   We positioned the patient in reverse Trendelenburg, tilted slightly to the patient's left. Patient had a fatty liver. The gallbladder was identified, the fundus grasped and retracted cephalad. Fair amount of Adhesions were lysed bluntly and with the electrocautery where indicated, taking care not to injure any adjacent organs or viscus. The infundibulum was grasped and retracted laterally, exposing the peritoneum overlying the triangle of Calot. This was then divided and exposed in a blunt fashion. A critical view of the cystic duct and cystic artery was obtained.  The cystic duct was clearly identified and bluntly dissected circumferentially. The cystic duct was ligated with a clip distally.   An incision was made in the cystic duct and the [facility] cholangiogram catheter introduced. The catheter was secured using a clip. A cholangiogram was then obtained which showed good visualization of the distal and proximal biliary tree with no sign of filling defects or obstruction.  Contrast flowed easily into the duodenum. The catheter was then removed.   The cystic duct was ligated with clips and divided. The cystic artery which had been identified and dissected free was ligated with clips and divided as well.   The gallbladder was dissected from the liver bed in retrograde fashion with the electrocautery. The  gallbladder was removed and placed in an Endocatch  sac.  The gallbladder and Endocatch sac were then removed through the umbilical port site. The liver bed was irrigated and inspected. Hemostasis was achieved with the electrocautery. Copious irrigation was utilized and was repeatedly aspirated until clear.  The pursestring suture was used to close the umbilical fascia.    We again inspected the right upper quadrant for hemostasis.  The umbilical closure was inspected and there was no air leak and nothing trapped within the closure. Pneumoperitoneum was released as we removed the trocars.  4-0 Monocryl was used to close the skin.   Benzoin, steri-strips, and clean dressings were applied. The patient was then extubated and brought to the recovery room in stable condition. Instrument, sponge, and needle counts were correct at closure and at the conclusion of the case.   Findings: Cholecystitis with Cholelithiasis  Estimated Blood Loss: Minimal         Drains: none         Specimens: Gallbladder           Complications: None; patient tolerated the procedure well.         Disposition: PACU - hemodynamically stable.         Condition: stable  [name]. Holly Campanile, MD, FACS General, Bariatric, & Minimally Invasive Surgery [facility], [place]

## 2014-08-31 NOTE — Anesthesia Procedure Notes (Signed)
Procedure Name: Intubation Date/Time: 08/31/2014 8:52 AM Performed by: Alanda Amass A Pre-anesthesia Checklist: Patient identified, Timeout performed, Emergency Drugs available, Suction available and Patient being monitored Patient Re-evaluated:Patient Re-evaluated prior to inductionOxygen Delivery Method: Circle system utilized Preoxygenation: Pre-oxygenation with 100% oxygen Intubation Type: IV induction, Rapid sequence and Cricoid Pressure applied Laryngoscope Size: Mac and 3 Grade View: Grade III Tube type: Oral Tube size: 7.5 mm Number of attempts: 1 Airway Equipment and Method: Stylet Placement Confirmation: ETT inserted through vocal cords under direct vision,  breath sounds checked- equal and bilateral and positive ETCO2 Secured at: 21 cm Tube secured with: Tape Dental Injury: Teeth and Oropharynx as per pre-operative assessment

## 2014-08-31 NOTE — Anesthesia Postprocedure Evaluation (Signed)
Anesthesia Post Note  Patient: [name]  Procedure(s) Performed: Procedure(s) (LRB): LAPAROSCOPIC CHOLECYSTECTOMY WITH INTRAOPERATIVE CHOLANGIOGRAM (N/A)  Anesthesia type: general  Patient location: PACU  Post pain: Pain level controlled  Post assessment: Patient's Cardiovascular Status Stable  Last Vitals:  Filed Vitals:   08/31/14 1200  BP: 129/74  Pulse: 68  Temp: 37.2 C  Resp: 17    Post vital signs: Reviewed and stable  Level of consciousness: sedated  Complications: No apparent anesthesia complications

## 2014-08-31 NOTE — ED Notes (Signed)
Surgeon at bedside.  

## 2014-08-31 NOTE — ED Notes (Signed)
Patient transported to Ultrasound 

## 2014-08-31 NOTE — H&P (Signed)
[name] is an 48 y.o. female.   Chief Complaint: Epigastric / RUQ abdominal pain, nausea, vomiting HPI: This is a 48 yo female who presents with a six-year history of intermittent RUQ/ epigastric pain, occurring every few months.  She was diagnosed previously with cholelithiasis, but was never referred for surgical evaluation.  About 20 hours ago, she awoke with severe RUQ/ epigastric pain that radiated around into her back.  After a few hours, she began vomiting.  No diarrhea.  She does report some significant abdominal bloating.    Past Medical History  Diagnosis Date  . Gallstones   Migraine headaches Seasonal allergies  PSH - Multiple knee surgeries  History reviewed. No pertinent family history. Social History:  reports that she has never smoked. She has never used smokeless tobacco. She reports that she does not drink alcohol or use illicit drugs.  Allergies:  Allergies  Allergen Reactions  . Citrus Hives  . Penicillins Hives  . Vicodin [Hydrocodone-Acetaminophen] Hives    Prior to Admission medications   Medication Sig Start Date End Date Taking? Authorizing Provider  fluticasone (FLONASE) 50 MCG/ACT nasal spray Place 2 sprays into both nostrils daily.   Yes Historical Provider, MD  omeprazole (PRILOSEC) 40 MG capsule Take 40 mg by mouth daily.   Yes Historical Provider, MD  SUMAtriptan (IMITREX) 100 MG tablet Take 100 mg by mouth every 2 (two) hours as needed for migraine or headache. May repeat in 2 hours if headache persists or recurs.   Yes Historical Provider, MD  traMADol (ULTRAM) 50 MG tablet Take 50 mg by mouth every 6 (six) hours as needed for moderate pain.   Yes Historical Provider, MD     Results for orders placed during the hospital encounter of 08/31/14 (from the past 48 hour(s))  CBC WITH DIFFERENTIAL     Status: Abnormal   Collection Time    08/30/14 11:10 PM      Result Value Ref Range   WBC 11.5 (*) 4.0 - 10.5 K/uL   Comment: REPEATED TO VERIFY    WHITE COUNT CONFIRMED ON SMEAR   RBC 5.06  3.87 - 5.11 MIL/uL   Hemoglobin 15.1 (*) 12.0 - 15.0 g/dL   HCT 43.0  36.0 - 46.0 %   MCV 85.0  78.0 - 100.0 fL   MCH 29.8  26.0 - 34.0 pg   MCHC 35.1  30.0 - 36.0 g/dL   RDW 14.3  11.5 - 15.5 %   Platelets    150 - 400 K/uL   Value: PLATELET CLUMPS NOTED ON SMEAR, COUNT APPEARS ADEQUATE   Neutrophils Relative % 86 (*) 43 - 77 %   Lymphocytes Relative 8 (*) 12 - 46 %   Monocytes Relative 6  3 - 12 %   Eosinophils Relative 0  0 - 5 %   Basophils Relative 0  0 - 1 %   Neutro Abs 9.9 (*) 1.7 - 7.7 K/uL   Lymphs Abs 0.9  0.7 - 4.0 K/uL   Monocytes Absolute 0.7  0.1 - 1.0 K/uL   Eosinophils Absolute 0.0  0.0 - 0.7 K/uL   Basophils Absolute 0.0  0.0 - 0.1 K/uL  COMPREHENSIVE METABOLIC PANEL     Status: Abnormal   Collection Time    08/30/14 11:10 PM      Result Value Ref Range   Sodium 136 (*) 137 - 147 mEq/L   Potassium 3.9  3.7 - 5.3 mEq/L   Chloride 95 (*) 96 - 112  mEq/L   CO2 23  19 - 32 mEq/L   Glucose, Bld 135 (*) 70 - 99 mg/dL   BUN 11  6 - 23 mg/dL   Creatinine, Ser 0.92  0.50 - 1.10 mg/dL   Calcium 9.9  8.4 - 10.5 mg/dL   Total Protein 8.4 (*) 6.0 - 8.3 g/dL   Albumin 4.3  3.5 - 5.2 g/dL   AST 561 (*) 0 - 37 U/L   ALT 530 (*) 0 - 35 U/L   Alkaline Phosphatase 197 (*) 39 - 117 U/L   Total Bilirubin 1.0  0.3 - 1.2 mg/dL   GFR calc non Af Amer 72 (*) >90 mL/min   GFR calc Af Amer 83 (*) >90 mL/min   Comment: (NOTE)     The eGFR has been calculated using the CKD EPI equation.     This calculation has not been validated in all clinical situations.     eGFR's persistently <90 mL/min signify possible Chronic Kidney     Disease.   Anion gap 18 (*) 5 - 15  LIPASE, BLOOD     Status: None   Collection Time    08/30/14 11:10 PM      Result Value Ref Range   Lipase 24  11 - 59 U/L  URINALYSIS, ROUTINE W REFLEX MICROSCOPIC     Status: Abnormal   Collection Time    08/30/14 11:18 PM      Result Value Ref Range   Color, Urine  YELLOW  YELLOW   APPearance CLOUDY (*) CLEAR   Specific Gravity, Urine 1.007  1.005 - 1.030   pH 6.0  5.0 - 8.0   Glucose, UA NEGATIVE  NEGATIVE mg/dL   Hgb urine dipstick NEGATIVE  NEGATIVE   Bilirubin Urine NEGATIVE  NEGATIVE   Ketones, ur NEGATIVE  NEGATIVE mg/dL   Protein, ur NEGATIVE  NEGATIVE mg/dL   Urobilinogen, UA 0.2  0.0 - 1.0 mg/dL   Nitrite NEGATIVE  NEGATIVE   Leukocytes, UA NEGATIVE  NEGATIVE   Comment: MICROSCOPIC NOT DONE ON URINES WITH NEGATIVE PROTEIN, BLOOD, LEUKOCYTES, NITRITE, OR GLUCOSE <1000 mg/dL.  POC URINE PREG, ED     Status: None   Collection Time    08/30/14 11:21 PM      Result Value Ref Range   Preg Test, Ur NEGATIVE  NEGATIVE   Comment:            THE SENSITIVITY OF THIS     METHODOLOGY IS >24 mIU/mL   US Abdomen Complete  08/31/2014   CLINICAL DATA:  Nausea and vomiting with generalized abdominal pain.  EXAM: ULTRASOUND ABDOMEN COMPLETE  COMPARISON:  02/27/2014  FINDINGS: Gallbladder:  Multiple stones and sludge layering in the dependent portion of the gallbladder. No gallbladder wall thickening. Murphy's sign is negative.  Common bile duct:  Diameter: 4.5 mm, normal  Liver:  Heterogeneous liver parenchymal echotexture most suggestive of fatty infiltration. No focal lesions identified.  IVC:  No abnormality visualized.  Pancreas:  Not visualized due to overlying bowel gas.  Spleen:  Size and appearance within normal limits.  Right Kidney:  Length: 10.6 cm. Echogenicity within normal limits. No mass or hydronephrosis visualized.  Left Kidney:  Length: 10.4 cm. Echogenicity within normal limits. No mass or hydronephrosis visualized.  Abdominal aorta:  No aneurysm visualized.  Other findings:  None.  IMPRESSION: Stones and sludge in the gallbladder. No associated inflammatory changes. Diffuse fatty infiltration of the liver.   Electronically Signed   By: Gwyndolyn Saxon  Gerilyn Nestle M.D.   On: 08/31/2014 03:06    Review of Systems  Constitutional: Negative for weight  loss.  HENT: Negative for ear discharge, ear pain, hearing loss and tinnitus.   Eyes: Negative for blurred vision, double vision, photophobia and pain.  Respiratory: Negative for cough, sputum production and shortness of breath.   Cardiovascular: Negative for chest pain.  Gastrointestinal: Positive for nausea, vomiting and abdominal pain.  Genitourinary: Negative for dysuria, urgency, frequency and flank pain.  Musculoskeletal: Negative for back pain, falls, joint pain, myalgias and neck pain.  Neurological: Negative for dizziness, tingling, sensory change, focal weakness, loss of consciousness and headaches.  Endo/Heme/Allergies: Does not bruise/bleed easily.  Psychiatric/Behavioral: Negative for depression, memory loss and substance abuse. The patient is not nervous/anxious.     Blood pressure 106/59, pulse 96, temperature 99.7 F (37.6 C), temperature source Oral, resp. rate 23, height '4\' 11"'  (1.499 m), weight 179 lb (81.194 kg), SpO2 93.00%. Physical Exam  WDWN in NAD HEENT:  EOMI, sclera anicteric Neck:  No masses, no thyromegaly Lungs:  CTA bilaterally; normal respiratory effort CV:  Regular rate and rhythm; no murmurs Abd:  Tender in epigastrium and RUQ; no palpable masses; + BS Ext:  Well-perfused; no edema Skin:  Warm, dry; no sign of jaundice  Assessment/Plan Despite the lack of wall thickening noted on the ultrasound, the patient has classic history and physical examination for acute calculus cholecystitis.  No sign of choledocholithiasis.  Admit to the hospital for hydration, IV abx Laparoscopic cholecystectomy later today or tomorrow.    Mayzie Caughlin K. 08/31/2014, 4:25 AM

## 2014-08-31 NOTE — Transfer of Care (Signed)
Immediate Anesthesia Transfer of Care Note  Patient: [name]  Procedure(s) Performed: Procedure(s): LAPAROSCOPIC CHOLECYSTECTOMY WITH INTRAOPERATIVE CHOLANGIOGRAM (N/A)  Patient Location: PACU  Anesthesia Type:General  Level of Consciousness: sedated  Airway & Oxygen Therapy: Patient Spontanous Breathing and Patient connected to face mask oxygen  Post-op Assessment: Report given to PACU RN and Post -op Vital signs reviewed and stable  Post vital signs: Reviewed and stable  Complications: No apparent anesthesia complications

## 2014-08-31 NOTE — Anesthesia Preprocedure Evaluation (Addendum)
Anesthesia Evaluation  Patient identified by MRN, date of birth, ID band Patient awake    Reviewed: Allergy & Precautions, H&P , NPO status , Patient's Chart, lab work & pertinent test results  Airway Mallampati: II TM Distance: >3 FB Neck ROM: Full    Dental  (+) Teeth Intact, Dental Advisory Given   Pulmonary          Cardiovascular     Neuro/Psych  Headaches,    GI/Hepatic GERD-  Medicated and Controlled,  Endo/Other    Renal/GU      Musculoskeletal   Abdominal   Peds  Hematology   Anesthesia Other Findings N/V this admission due to cholecystitis  Reproductive/Obstetrics                           Anesthesia Physical Anesthesia Plan  ASA: II  Anesthesia Plan: General   Post-op Pain Management:    Induction: Intravenous  Airway Management Planned: Oral ETT  Additional Equipment:   Intra-op Plan:   Post-operative Plan: Extubation in OR  Informed Consent: I have reviewed the patients History and Physical, chart, labs and discussed the procedure including the risks, benefits and alternatives for the proposed anesthesia with the patient or authorized representative who has indicated his/her understanding and acceptance.     Plan Discussed with: CRNA and Surgeon  Anesthesia Plan Comments:         Anesthesia Quick Evaluation

## 2014-08-31 NOTE — Progress Notes (Signed)
UR completed 

## 2014-09-01 ENCOUNTER — Encounter (HOSPITAL_COMMUNITY): Payer: Self-pay | Admitting: General Practice

## 2014-09-01 LAB — COMPREHENSIVE METABOLIC PANEL
ALT: 304 U/L — AB (ref 0–35)
ANION GAP: 11 (ref 5–15)
AST: 147 U/L — ABNORMAL HIGH (ref 0–37)
Albumin: 3.3 g/dL — ABNORMAL LOW (ref 3.5–5.2)
Alkaline Phosphatase: 120 U/L — ABNORMAL HIGH (ref 39–117)
BUN: 8 mg/dL (ref 6–23)
CALCIUM: 9 mg/dL (ref 8.4–10.5)
CO2: 24 meq/L (ref 19–32)
Chloride: 102 mEq/L (ref 96–112)
Creatinine, Ser: 0.89 mg/dL (ref 0.50–1.10)
GFR calc Af Amer: 87 mL/min — ABNORMAL LOW (ref >90)
GFR, EST NON AFRICAN AMERICAN: 75 mL/min — AB (ref >90)
Glucose, Bld: 134 mg/dL — ABNORMAL HIGH (ref 70–99)
Potassium: 4.9 mEq/L (ref 3.7–5.3)
SODIUM: 137 meq/L (ref 137–147)
Total Bilirubin: 0.7 mg/dL (ref 0.3–1.2)
Total Protein: 6.9 g/dL (ref 6.0–8.3)

## 2014-09-01 MED ORDER — TRAMADOL HCL 50 MG PO TABS
50.0000 mg | ORAL_TABLET | Freq: Four times a day (QID) | ORAL | Status: DC | PRN
  Administered 2014-09-01: 50 mg via ORAL
  Filled 2014-09-01: qty 1

## 2014-09-01 MED ORDER — ACETAMINOPHEN 325 MG PO TABS
650.0000 mg | ORAL_TABLET | ORAL | Status: DC | PRN
  Administered 2014-09-01 – 2014-09-03 (×2): 650 mg via ORAL
  Filled 2014-09-01 (×2): qty 2

## 2014-09-01 MED ORDER — TRAMADOL HCL 50 MG PO TABS
50.0000 mg | ORAL_TABLET | Freq: Four times a day (QID) | ORAL | Status: DC | PRN
  Administered 2014-09-01 – 2014-09-04 (×2): 100 mg via ORAL
  Filled 2014-09-01 (×2): qty 2

## 2014-09-01 MED ORDER — FLUTICASONE PROPIONATE 50 MCG/ACT NA SUSP
2.0000 | Freq: Every day | NASAL | Status: DC
  Administered 2014-09-01 – 2014-09-04 (×4): 2 via NASAL
  Filled 2014-09-01: qty 16

## 2014-09-01 MED ORDER — DIPHENHYDRAMINE HCL 50 MG/ML IJ SOLN
12.5000 mg | INTRAMUSCULAR | Status: DC | PRN
  Administered 2014-09-01 – 2014-09-02 (×2): 25 mg via INTRAVENOUS
  Filled 2014-09-01 (×2): qty 1

## 2014-09-01 MED ORDER — SUMATRIPTAN SUCCINATE 100 MG PO TABS
100.0000 mg | ORAL_TABLET | ORAL | Status: DC | PRN
  Administered 2014-09-02: 100 mg via ORAL
  Filled 2014-09-01 (×2): qty 1

## 2014-09-01 MED ORDER — HYDROMORPHONE HCL 2 MG PO TABS
2.0000 mg | ORAL_TABLET | ORAL | Status: DC | PRN
  Administered 2014-09-01 – 2014-09-02 (×3): 2 mg via ORAL
  Filled 2014-09-01 (×3): qty 1

## 2014-09-01 MED ORDER — FENTANYL CITRATE 0.05 MG/ML IJ SOLN
12.5000 ug | INTRAMUSCULAR | Status: DC | PRN
  Administered 2014-09-01 – 2014-09-03 (×3): 12.5 ug via INTRAVENOUS
  Filled 2014-09-01 (×4): qty 2

## 2014-09-01 MED ORDER — PANTOPRAZOLE SODIUM 40 MG PO TBEC
40.0000 mg | DELAYED_RELEASE_TABLET | Freq: Every day | ORAL | Status: DC
  Administered 2014-09-01 – 2014-09-03 (×3): 40 mg via ORAL
  Filled 2014-09-01 (×3): qty 1

## 2014-09-01 NOTE — Progress Notes (Signed)
1 Day Post-Op  Subjective: Itching, pain not well controlled, tol fulls, ambulating   Objective: Vital signs in last 24 hours: Temp:  [98.1 F (36.7 C)-100.1 F (37.8 C)] 98.9 F (37.2 C) (09/07 0547) Pulse Rate:  [68-92] 80 (09/07 0547) Resp:  [15-19] 17 (09/07 0547) BP: (103-141)/(59-79) 121/67 mmHg (09/07 0547) SpO2:  [82 %-100 %] 93 % (09/07 0547)    Intake/Output from previous day: 09/06 0701 - 09/07 0700 In: 3969.3 [P.O.:336; I.V.:3633.3] Out: 1560 [Urine:1550; Blood:10] Intake/Output this shift:    General appearance: no distress Resp: clear to auscultation bilaterally Cardio: regular rate and rhythm GI: soft approp tender dressings dry  Lab Results:   Recent Labs  08/30/14 2310 08/31/14 0614  WBC 11.5* 10.0  HGB 15.1* 12.9  HCT 43.0 37.5  PLT PLATELET CLUMPS NOTED ON SMEAR, COUNT APPEARS ADEQUATE 201   BMET  Recent Labs  08/30/14 2310 08/31/14 0614 09/01/14 0346  NA 136*  --  137  K 3.9  --  4.9  CL 95*  --  102  CO2 23  --  24  GLUCOSE 135*  --  134*  BUN 11  --  8  CREATININE 0.92 0.91 0.89  CALCIUM 9.9  --  9.0   PT/INR No results found for this basename: LABPROT, INR,  in the last 72 hours ABG No results found for this basename: PHART, PCO2, PO2, HCO3,  in the last 72 hours  Studies/Results: Dg Cholangiogram Operative  08/31/2014   CLINICAL DATA:  Cholecystitis  EXAM: INTRAOPERATIVE CHOLANGIOGRAM  TECHNIQUE: Cholangiographic images from the C-arm fluoroscopic device were submitted for interpretation post-operatively. Please see the procedural report for the amount of contrast and the fluoroscopy time utilized.  COMPARISON:  None.  FINDINGS: Contrast fills the biliary tree without filling defects in the common bile duct.  IMPRESSION: Patent biliary tree without common duct stones.   Electronically Signed   By: Maryclare Bean M.D.   On: 08/31/2014 10:12   US Abdomen Complete  08/31/2014   CLINICAL DATA:  Nausea and vomiting with generalized  abdominal pain.  EXAM: ULTRASOUND ABDOMEN COMPLETE  COMPARISON:  02/27/2014  FINDINGS: Gallbladder:  Multiple stones and sludge layering in the dependent portion of the gallbladder. No gallbladder wall thickening. Murphy's sign is negative.  Common bile duct:  Diameter: 4.5 mm, normal  Liver:  Heterogeneous liver parenchymal echotexture most suggestive of fatty infiltration. No focal lesions identified.  IVC:  No abnormality visualized.  Pancreas:  Not visualized due to overlying bowel gas.  Spleen:  Size and appearance within normal limits.  Right Kidney:  Length: 10.6 cm. Echogenicity within normal limits. No mass or hydronephrosis visualized.  Left Kidney:  Length: 10.4 cm. Echogenicity within normal limits. No mass or hydronephrosis visualized.  Abdominal aorta:  No aneurysm visualized.  Other findings:  None.  IMPRESSION: Stones and sludge in the gallbladder. No associated inflammatory changes. Diffuse fatty infiltration of the liver.   Electronically Signed   By: Burman Nieves M.D.   On: 08/31/2014 03:06     Assessment/Plan: POD 1 lap chole  1. Having a lot of itching from dilaudid, not really able to take many pain meds without itching.  We decided to put her back on tramadol, try oral dilaudid with benadryl.  I think she will be here another 24 hours due to pain control and med reaction 2. Advance diet as tolerated 3. Oob/pulm toilet 4. Scds/lovenox 5. lfts better today, will not recheck  [facility] 09/01/2014

## 2014-09-02 ENCOUNTER — Observation Stay (HOSPITAL_COMMUNITY): Payer: BC Managed Care – PPO

## 2014-09-02 ENCOUNTER — Encounter (HOSPITAL_COMMUNITY): Payer: Self-pay | Admitting: General Surgery

## 2014-09-02 LAB — URINALYSIS, ROUTINE W REFLEX MICROSCOPIC
BILIRUBIN URINE: NEGATIVE
Glucose, UA: NEGATIVE mg/dL
HGB URINE DIPSTICK: NEGATIVE
Ketones, ur: NEGATIVE mg/dL
Leukocytes, UA: NEGATIVE
NITRITE: NEGATIVE
PROTEIN: NEGATIVE mg/dL
Specific Gravity, Urine: 1.016 (ref 1.005–1.030)
UROBILINOGEN UA: 0.2 mg/dL (ref 0.0–1.0)
pH: 6 (ref 5.0–8.0)

## 2014-09-02 MED ORDER — MAGNESIUM CITRATE PO SOLN
1.0000 | Freq: Once | ORAL | Status: AC
  Administered 2014-09-02: 1 via ORAL
  Filled 2014-09-02: qty 296

## 2014-09-02 MED ORDER — KETOROLAC TROMETHAMINE 15 MG/ML IJ SOLN
15.0000 mg | Freq: Once | INTRAMUSCULAR | Status: AC
  Administered 2014-09-02: 15 mg via INTRAVENOUS
  Filled 2014-09-02: qty 1

## 2014-09-02 MED ORDER — BISACODYL 10 MG RE SUPP
10.0000 mg | Freq: Once | RECTAL | Status: AC
  Administered 2014-09-02: 10 mg via RECTAL
  Filled 2014-09-02: qty 1

## 2014-09-02 NOTE — Progress Notes (Signed)
Utilization review completed.  

## 2014-09-02 NOTE — Progress Notes (Signed)
0600 Patient is now afebrile at 0100 O2 sats was 80% on room air O2 applied via nasal cannula. O2 sats 88% on 2L/ml nasal cannula. O2 increase to 3l/m and O2 sats 93%. Patient is only getting to 750 on her IS. O2 sats now 94% on 3l/m nasal cannula. Will continue to monitor.

## 2014-09-02 NOTE — Progress Notes (Signed)
Patient ID: [name], female   DOB: 1966/07/30, 48 y.o.   MRN: [id number]     [address] [place], [place] [address]    Phone: [phone] FAX: [phone]     Subjective: [name] x2 yesterday.  Pulling 539m on IS.  Sitting up in a chair.  Voiding.  Bloated, burping.  Tolerating POs.  Febrile last night.   Objective:  Vital signs:  Filed Vitals:   09/01/14 2210 09/02/14 0053 09/02/14 0105 09/02/14 0518  BP:  127/72  120/79  Pulse:  106  85  Temp: 100.6 F (38.1 C) 99.7 F (37.6 C)  98.8 F (37.1 C)  TempSrc: Oral Oral  Oral  Resp:  24  19  Height:      Weight:      SpO2:  80% 93% 94%    Last BM Date: 08/30/14  Intake/Output   Yesterday:  09/07 0701 - 09/08 0700 In: 480 [P.O.:480] Out: 2900 [Urine:2900] This shift: I/O last 3 completed shifts: In: 158[P.O.:480; I.V.:1200] Out: 3900 [Urine:3900]    Physical Exam: General: Pt awake/alert/oriented x4 in no acute distress Chest: diminished BLL.   No chest wall pain w good excursion CV:  Pulses intact.  Regular rhythm MS: Normal AROM mjr joints.  No obvious deformity Abdomen: Soft.  Nondistended. Appropriately tender.  Incisions are c/d/i.  No evidence of peritonitis.  No incarcerated hernias. Ext:  SCDs BLE.  No mjr edema.  No cyanosis Skin: No petechiae / purpura   Problem List:   Active Problems:   Calculus of gallbladder with acute cholecystitis    Results:   Labs: Results for orders placed during the hospital encounter of 08/31/14 (from the past 48 hour(s))  COMPREHENSIVE METABOLIC PANEL     Status: Abnormal   Collection Time    09/01/14  3:46 AM      Result Value Ref Range   Sodium 137  137 - 147 mEq/L   Potassium 4.9  3.7 - 5.3 mEq/L   Comment: DELTA CHECK NOTED   Chloride 102  96 - 112 mEq/L   CO2 24  19 - 32 mEq/L   Glucose, Bld 134 (*) 70 - 99 mg/dL   BUN 8  6 - 23 mg/dL   Creatinine, Ser 0.89  0.50 - 1.10 mg/dL    Calcium 9.0  8.4 - 10.5 mg/dL   Total Protein 6.9  6.0 - 8.3 g/dL   Albumin 3.3 (*) 3.5 - 5.2 g/dL   AST 147 (*) 0 - 37 U/L   ALT 304 (*) 0 - 35 U/L   Alkaline Phosphatase 120 (*) 39 - 117 U/L   Total Bilirubin 0.7  0.3 - 1.2 mg/dL   GFR calc non Af Amer 75 (*) >90 mL/min   GFR calc Af Amer 87 (*) >90 mL/min   Comment: (NOTE)     The eGFR has been calculated using the CKD EPI equation.     This calculation has not been validated in all clinical situations.     eGFR's persistently <90 mL/min signify possible Chronic Kidney     Disease.   Anion gap 11  5 - 15    Imaging / Studies: Dg Cholangiogram Operative  08/31/2014   CLINICAL DATA:  Cholecystitis  EXAM: INTRAOPERATIVE CHOLANGIOGRAM  TECHNIQUE: Cholangiographic images from the C-arm fluoroscopic device were submitted for interpretation post-operatively. Please see the procedural report for the amount of contrast and the  fluoroscopy time utilized.  COMPARISON:  None.  FINDINGS: Contrast fills the biliary tree without filling defects in the common bile duct.  IMPRESSION: Patent biliary tree without common duct stones.   Electronically Signed   By: Art  Hoss M.D.   On: 08/31/2014 10:12    Medications / Allergies:  Scheduled Meds: . bisacodyl  10 mg Rectal Once  . enoxaparin (LOVENOX) injection  40 mg Subcutaneous Q24H  . fluticasone  2 spray Each Nare Daily  . ketorolac  15 mg Intravenous Once  . pantoprazole  40 mg Oral Q1200   Continuous Infusions: . dextrose 5 % and 0.45 % NaCl with KCl 20 mEq/L 10 mL/hr at 09/01/14 0917   PRN Meds:.acetaminophen, diphenhydrAMINE, fentaNYL, HYDROmorphone, ondansetron, SUMAtriptan, traMADol  Antibiotics: Anti-infectives   Start     Dose/Rate Route Frequency Ordered Stop   08/31/14 0600  [MAR Hold]  ciprofloxacin (CIPRO) IVPB 400 mg  Status:  Discontinued     (On MAR Hold since 08/31/14 1148)   400 mg 200 mL/hr over 60 Minutes Intravenous Every 12 hours 08/31/14 0518 08/31/14 1158         Assessment/Plan POD#2 laparoscopic cholecystectomy---Dr. Wilson Low grade fevers -suspect fevers are from lack of IS use and mobilization.  Will obtain a CXR and a UA to ensue no infectious source.  Discussed the importance of above therapies, reinforced IS use -give todarol x1 dose for pain, then scheduled ibuprofen -SCD/lovenox -dulcolax x1 dose -mobilize -continue inpatient today to ensure the patient remains afebrile for 24h.   [name], ANP-BC [facility] Pager [phone](7A-4:30P) For consults and floor pages call [phone](7A-4:30P)  09/02/2014 8:15 AM    

## 2014-09-02 NOTE — Progress Notes (Signed)
I have seen and examined the patient and agree with the assessment and plans. Needs to ambulate and needs continued IS.  Checking CXR, UA  Vannie Hilgert A. Magnus Ivan  MD, FACS

## 2014-09-03 ENCOUNTER — Observation Stay (HOSPITAL_COMMUNITY): Payer: BC Managed Care – PPO

## 2014-09-03 LAB — CBC WITH DIFFERENTIAL/PLATELET
Basophils Absolute: 0.1 10*3/uL (ref 0.0–0.1)
Basophils Relative: 1 % (ref 0–1)
Eosinophils Absolute: 0.3 10*3/uL (ref 0.0–0.7)
Eosinophils Relative: 5 % (ref 0–5)
HCT: 34.3 % — ABNORMAL LOW (ref 36.0–46.0)
HEMOGLOBIN: 11.5 g/dL — AB (ref 12.0–15.0)
LYMPHS ABS: 2.3 10*3/uL (ref 0.7–4.0)
LYMPHS PCT: 30 % (ref 12–46)
MCH: 29.2 pg (ref 26.0–34.0)
MCHC: 33.5 g/dL (ref 30.0–36.0)
MCV: 87.1 fL (ref 78.0–100.0)
MONOS PCT: 10 % (ref 3–12)
Monocytes Absolute: 0.8 10*3/uL (ref 0.1–1.0)
NEUTROS ABS: 4.1 10*3/uL (ref 1.7–7.7)
NEUTROS PCT: 54 % (ref 43–77)
PLATELETS: 247 10*3/uL (ref 150–400)
RBC: 3.94 MIL/uL (ref 3.87–5.11)
RDW: 14.6 % (ref 11.5–15.5)
WBC: 7.6 10*3/uL (ref 4.0–10.5)

## 2014-09-03 MED ORDER — METOCLOPRAMIDE HCL 5 MG/ML IJ SOLN
10.0000 mg | Freq: Four times a day (QID) | INTRAMUSCULAR | Status: DC
  Administered 2014-09-03 – 2014-09-04 (×4): 10 mg via INTRAVENOUS
  Filled 2014-09-03 (×8): qty 2

## 2014-09-03 MED ORDER — IBUPROFEN 600 MG PO TABS
600.0000 mg | ORAL_TABLET | Freq: Three times a day (TID) | ORAL | Status: DC
  Administered 2014-09-03 – 2014-09-04 (×4): 600 mg via ORAL
  Filled 2014-09-03 (×6): qty 1

## 2014-09-03 MED ORDER — SENNOSIDES-DOCUSATE SODIUM 8.6-50 MG PO TABS
1.0000 | ORAL_TABLET | Freq: Two times a day (BID) | ORAL | Status: DC
  Administered 2014-09-03 – 2014-09-04 (×2): 1 via ORAL
  Filled 2014-09-03 (×2): qty 1

## 2014-09-03 MED ORDER — FLEET ENEMA 7-19 GM/118ML RE ENEM
1.0000 | ENEMA | Freq: Once | RECTAL | Status: AC
  Administered 2014-09-03: 14:00:00 via RECTAL
  Filled 2014-09-03: qty 1

## 2014-09-03 NOTE — Progress Notes (Signed)
Patient ID: [name], female   DOB: March 16, 1966, 48 y.o.   MRN: [id number]  CBC unremarkable.  VSS.  Afebrile.  Medically stable for DC, but does not wish to go home before having a BM.  Enema given.   Myya Meenach, ANP-BC

## 2014-09-03 NOTE — Progress Notes (Signed)
I have seen and examined the patient and agree with the assessment and plans.  Nataliyah Packham A. Sharifah Champine  MD, FACS  

## 2014-09-03 NOTE — Progress Notes (Signed)
Utilization review completed.  

## 2014-09-03 NOTE — Progress Notes (Signed)
Patient ID: [name], female   DOB: 17-Apr-1966, 48 y.o.   MRN: [id number]     [facility]      [address]   [place], [place] [address]    Phone: [phone] FAX: [phone]     Subjective: Passing flatus.  Sore, but pain is okay.  Ambulating.  Afebrile.    Objective:  Vital signs:  Filed Vitals:   09/02/14 1344 09/02/14 1532 09/02/14 2056 09/03/14 0600  BP: 122/74 113/70 120/74 124/71  Pulse: 82 81 94 72  Temp: 98.2 F (36.8 C) 99.6 F (37.6 C) 99.2 F (37.3 C) 99.2 F (37.3 C)  TempSrc: Oral Oral Oral Oral  Resp: Height:      Weight:      SpO2: 95% 100% 93% 97%    Last BM Date: 09/02/14 (small)  Intake/Output   Yesterday:  09/08 0701 - 09/09 0700 In: 360 [P.O.:360] Out: 400 [Urine:400] This shift:    I/O last 3 completed shifts: In: 360 [P.O.:360] Out: 1400 [Urine:1400]    Physical Exam:  General: Pt awake/alert/oriented x4 in no acute distress  Chest: clear BLL. No chest wall pain w good excursion  CV: Pulses intact. Regular rhythm  MS: Normal AROM mjr joints. No obvious deformity  Abdomen: Soft. Nondistended. Appropriately tender. Incisions are c/d/i. No evidence of peritonitis. No incarcerated hernias.  Ext: SCDs BLE. No mjr edema. No cyanosis  Skin: No petechiae / purpura   Problem List:   Active Problems:   Calculus of gallbladder with acute cholecystitis    Results:   Labs: Results for orders placed during the hospital encounter of 08/31/14 (from the past 48 hour(s))  URINALYSIS, ROUTINE W REFLEX MICROSCOPIC     Status: Abnormal   Collection Time    09/02/14 11:17 AM      Result Value Ref Range   Color, Urine AMBER (*) YELLOW   Comment: BIOCHEMICALS MAY BE AFFECTED BY COLOR   APPearance CLEAR  CLEAR   Specific Gravity, Urine 1.016  1.005 - 1.030   pH 6.0  5.0 - 8.0   Glucose, UA NEGATIVE  NEGATIVE mg/dL   Hgb urine dipstick NEGATIVE  NEGATIVE   Bilirubin Urine NEGATIVE   NEGATIVE   Ketones, ur NEGATIVE  NEGATIVE mg/dL   Protein, ur NEGATIVE  NEGATIVE mg/dL   Urobilinogen, UA 0.2  0.0 - 1.0 mg/dL   Nitrite NEGATIVE  NEGATIVE   Leukocytes, UA NEGATIVE  NEGATIVE   Comment: MICROSCOPIC NOT DONE ON URINES WITH NEGATIVE PROTEIN, BLOOD, LEUKOCYTES, NITRITE, OR GLUCOSE <1000 mg/dL.    Imaging / Studies: Dg Chest 2 View  09/02/2014   CLINICAL DATA:  Shortness of breath, fever, POD #3 cholecystectomy  EXAM: CHEST  2 VIEW  COMPARISON:  None  FINDINGS: Minimal enlargement of cardiac silhouette.  Mediastinal contours and pulmonary vascularity normal.  Peribronchial thickening basilar atelectasis and questionable LEFT perihilar infiltrate.  No pleural effusion or pneumothorax.  Bones unremarkable.  IMPRESSION: Bronchitic changes with bibasilar atelectasis and questionable LEFT perihilar infiltrate.   Electronically Signed   By: Ulyses Southward M.D.   On: 09/02/2014 12:00   Dg Chest Port 1 View  09/03/2014   CLINICAL DATA:  Shortness of breath, pneumonia  EXAM: PORTABLE CHEST - 1 VIEW  COMPARISON:  09/02/2014  FINDINGS: There are low lung volumes. There is bilateral interstitial prominence. There is no pleural effusion or pneumothorax. There is left perihilar airspace disease which may reflect atelectasis versus  pneumonia. Stable cardiomediastinal silhouette. Unremarkable osseous structures.  IMPRESSION: Left perihilar airspace disease which may reflect atelectasis versus pneumonia. Bilateral interstitial prominence likely related to low lung volumes.   Electronically Signed   By: Elige Ko   On: 09/03/2014 08:10    Medications / Allergies:  Scheduled Meds: . enoxaparin (LOVENOX) injection  40 mg Subcutaneous Q24H  . fluticasone  2 spray Each Nare Daily  . pantoprazole  40 mg Oral Q1200   Continuous Infusions: . dextrose 5 % and 0.45 % NaCl with KCl 20 mEq/L 10 mL/hr at 09/01/14 0917   PRN Meds:.acetaminophen, diphenhydrAMINE, fentaNYL, HYDROmorphone, ondansetron,  SUMAtriptan, traMADol  Antibiotics: Anti-infectives   Start     Dose/Rate Route Frequency Ordered Stop   08/31/14 0600  [MAR Hold]  ciprofloxacin (CIPRO) IVPB 400 mg  Status:  Discontinued     (On MAR Hold since 08/31/14 1148)   400 mg 200 mL/hr over 60 Minutes Intravenous Every 12 hours 08/31/14 0518 08/31/14 1158      Assessment/Plan  POD#3 laparoscopic cholecystectomy---Dr. Andrey Campanile  Low grade fevers -resolved, await CBC, if normal, DC home.  -ibuprofen, tramadol for pain -SCD/lovenox  -mobilize  -anticipate DC today  Ashok Norris, ANP-BC [facility] Pager [phone](7A-4:30P) For consults and floor pages call [phone](7A-4:30P)  09/03/2014 8:40 AM

## 2014-09-03 NOTE — Discharge Instructions (Signed)

## 2014-09-04 MED ORDER — IBUPROFEN 600 MG PO TABS: 600.0000 mg | ORAL_TABLET | Freq: Three times a day (TID) | ORAL | Status: DC

## 2014-09-04 MED ORDER — TRAMADOL HCL 50 MG PO TABS: 50.0000 mg | ORAL_TABLET | Freq: Four times a day (QID) | ORAL | Status: DC | PRN

## 2014-09-04 MED ORDER — SENNOSIDES-DOCUSATE SODIUM 8.6-50 MG PO TABS: 1.0000 | ORAL_TABLET | Freq: Two times a day (BID) | ORAL | Status: DC

## 2014-09-04 MED ORDER — POLYETHYLENE GLYCOL 3350 17 GM/SCOOP PO POWD: 17.0000 g | Freq: Every day | ORAL | Status: DC

## 2014-09-04 MED ORDER — ACETAMINOPHEN 325 MG PO TABS: 650.0000 mg | ORAL_TABLET | ORAL | Status: DC | PRN

## 2014-09-04 NOTE — Discharge Summary (Signed)
Physician Discharge Summary  [name] ZOX:[id number] DOB: [birth year] DOA: 08/31/2014  PCP: Georgann Housekeeper, MD  Consultation: none  Admit date: 08/31/2014 Discharge date: 09/04/2014  Recommendations for Outpatient Follow-up:   Follow-up Information   Follow up with [facility] On 09/30/2014. (ARRIVE NO LATER THAN 1:30PM FOR A 2PM APPT. THERE WILL BE LOTS OF PAPERWORK TO COMPLETE.)    Contact information:   [address]   [place] [address] [phone]       Call Georgann Housekeeper, MD.   Specialty:  Internal Medicine   Contact information:   [address], [address] [place] [address] [phone]      Discharge Diagnoses:  1. Cholecystitis with cholelithiasis 2. Post operative ileus   Surgical Procedure: laparoscopic cholecystectomy with IOC---Dr. Andrey Campanile  Discharge Condition: stable Disposition: home  Diet recommendation: regular, high fiber  [facility] Weights   08/30/14 2224 08/31/14 0532  Weight: 179 lb (81.194 kg) 198 lb 4.8 oz (89.948 kg)    Filed Vitals:   09/04/14 0546  BP: 139/69  Pulse: 64  Temp: 98.7 F (37.1 C)  Resp: 20      Hospital Course:  [name] presented to [facility] with RUQ abdominal pain.  She was found to have acute calculous cholecystitis.  She underwent the procedure listed above which she tolerated well.  She developed fevers with a negative work up which resolved with mobilization and IS use.  She had a mild ileus which resolved with laxatives.  She otherwise remained stable, normal labs, vital signs.  On POD#4 she was felt stable for discharge home.  Medication risks, benefits and therapeutic alternatives were reviewed with the patient.  She verbalizes understanding. A follow up appt has been made on her behalf.  We discussed warning signs that warrant immediate attention.  She was encouraged to call with questions or concerns.      Discharge Instructions     Medication List         acetaminophen  325 MG tablet  Commonly known as:  TYLENOL  Take 2 tablets (650 mg total) by mouth every 4 (four) hours as needed for fever (Temp > 101.0).     fluticasone 50 MCG/ACT nasal spray  Commonly known as:  FLONASE  Place 2 sprays into both nostrils daily.     ibuprofen 600 MG tablet  Commonly known as:  ADVIL,MOTRIN  Take 1 tablet (600 mg total) by mouth 3 (three) times daily.     omeprazole 40 MG capsule  Commonly known as:  PRILOSEC  Take 40 mg by mouth daily.     polyethylene glycol powder powder  Commonly known as:  MIRALAX  Take 17 g by mouth daily. To correct constipation.  Adjust dose over 1-2 months.  Goal = ~1 bowel movement / day     senna-docusate 8.6-50 MG per tablet  Commonly known as:  Senokot-S  Take 1 tablet by mouth 2 (two) times daily.     SUMAtriptan 100 MG tablet  Commonly known as:  IMITREX  Take 100 mg by mouth every 2 (two) hours as needed for migraine or headache. May repeat in 2 hours if headache persists or recurs.     traMADol 50 MG tablet  Commonly known as:  ULTRAM  Take 1-2 tablets (50-100 mg total) by mouth every 6 (six) hours as needed for moderate pain.           Follow-up Information   Follow up with [facility] On 09/30/2014. (ARRIVE NO LATER THAN 1:30PM FOR A 2PM APPT. THERE WILL BE LOTS OF PAPERWORK TO COMPLETE.)    Contact information:   [address]   [place] [address] [phone]       Call Georgann Housekeeper, MD.   Specialty:  Internal Medicine   Contact information:   [address], [address] [place] [address] [phone]        The results of significant diagnostics from this hospitalization (including imaging, microbiology, ancillary and laboratory) are listed below for reference.    Significant Diagnostic Studies: Dg Chest 2 View  09/02/2014   CLINICAL DATA:  Shortness of breath, fever, POD #3 cholecystectomy  EXAM: CHEST  2 VIEW  COMPARISON:  None  FINDINGS: Minimal enlargement of  cardiac silhouette.  Mediastinal contours and pulmonary vascularity normal.  Peribronchial thickening basilar atelectasis and questionable LEFT perihilar infiltrate.  No pleural effusion or pneumothorax.  Bones unremarkable.  IMPRESSION: Bronchitic changes with bibasilar atelectasis and questionable LEFT perihilar infiltrate.   Electronically Signed   By: Ulyses Southward M.D.   On: 09/02/2014 12:00   Dg Cholangiogram Operative  08/31/2014   CLINICAL DATA:  Cholecystitis  EXAM: INTRAOPERATIVE CHOLANGIOGRAM  TECHNIQUE: Cholangiographic images from the C-arm fluoroscopic device were submitted for interpretation post-operatively. Please see the procedural report for the amount of contrast and the fluoroscopy time utilized.  COMPARISON:  None.  FINDINGS: Contrast fills the biliary tree without filling defects in the common bile duct.  IMPRESSION: Patent biliary tree without common duct stones.   Electronically Signed   By: Maryclare Bean M.D.   On: 08/31/2014 10:12   US Abdomen Complete  08/31/2014   CLINICAL DATA:  Nausea and vomiting with generalized abdominal pain.  EXAM: ULTRASOUND ABDOMEN COMPLETE  COMPARISON:  02/27/2014  FINDINGS: Gallbladder:  Multiple stones and sludge layering in the dependent portion of the gallbladder. No gallbladder wall thickening. Murphy's sign is negative.  Common bile duct:  Diameter: 4.5 mm, normal  Liver:  Heterogeneous liver parenchymal echotexture most suggestive of fatty infiltration. No focal lesions identified.  IVC:  No abnormality visualized.  Pancreas:  Not visualized due to overlying bowel gas.  Spleen:  Size and appearance within normal limits.  Right Kidney:  Length: 10.6 cm. Echogenicity within normal limits. No mass or hydronephrosis visualized.  Left Kidney:  Length: 10.4 cm. Echogenicity within normal limits. No mass or hydronephrosis visualized.  Abdominal aorta:  No aneurysm visualized.  Other findings:  None.  IMPRESSION: Stones and sludge in the gallbladder. No associated  inflammatory changes. Diffuse fatty infiltration of the liver.   Electronically Signed   By: Burman Nieves M.D.   On: 08/31/2014 03:06   Dg Chest Port 1 View  09/03/2014   CLINICAL DATA:  Shortness of breath, pneumonia  EXAM: PORTABLE CHEST - 1 VIEW  COMPARISON:  09/02/2014  FINDINGS: There are low lung volumes. There is bilateral interstitial prominence. There is no pleural effusion or pneumothorax. There is left perihilar airspace disease which may reflect atelectasis versus pneumonia. Stable cardiomediastinal silhouette. Unremarkable osseous structures.  IMPRESSION: Left perihilar airspace disease which may reflect atelectasis versus pneumonia. Bilateral interstitial prominence likely related to low lung volumes.   Electronically Signed   By: Elige Ko   On: 09/03/2014 08:10   Dg Abd 2 Views  09/03/2014   CLINICAL DATA:  48 year old female status post gallbladder surgery. Query ileus. Initial encounter.  EXAM: ABDOMEN - 2 VIEW  COMPARISON:  Intraoperative cholangiogram 08/31/2014. Upper GI 09/30/2008.  FINDINGS: Stable cholecystectomy clips. Mildly increased gas-filled loops compared to 2009, but no dilated loops are identified. Air bronchograms in the medial right lung base are noted. No pneumoperitoneum. Chronic pelvic phleboliths. Small volume retained contrast in the appendix. No acute osseous abnormality identified.  IMPRESSION: 1.  Non obstructed bowel gas pattern, no free air. 2. Medial right lower lobe consolidation with air bronchograms, consider postoperative atelectasis or pneumonia.   Electronically Signed   By: Augusto Gamble M.D.   On: 09/03/2014 11:18    Microbiology: Recent Results (from the past 240 hour(s))  SURGICAL PCR SCREEN     Status: Abnormal   Collection Time    08/31/14  5:59 AM      Result Value Ref Range Status   MRSA, PCR NEGATIVE  NEGATIVE Final   Staphylococcus aureus POSITIVE (*) NEGATIVE Final   Comment:            The Xpert SA Assay (FDA     approved for NASAL  specimens     in patients over 33 years of age),     is one component of     a comprehensive surveillance     program.  Test performance has     been validated by [employer] for patients greater     than or equal to 26 year old.     It is not intended     to diagnose infection nor to     guide or monitor treatment.     Labs: Basic Metabolic Panel:  Recent Labs Lab 08/30/14 2310 08/31/14 0614 09/01/14 0346  NA 136*  --  137  K 3.9  --  4.9  CL 95*  --  102  CO2 23  --  24  GLUCOSE 135*  --  134*  BUN 11  --  8  CREATININE 0.92 0.91 0.89  CALCIUM 9.9  --  9.0   Liver Function Tests:  Recent Labs Lab 08/30/14 2310 09/01/14 0346  AST 561* 147*  ALT 530* 304*  ALKPHOS 197* 120*  BILITOT 1.0 0.7  PROT 8.4* 6.9  ALBUMIN 4.3 3.3*    Recent Labs Lab 08/30/14 2310  LIPASE 24   No results found for this basename: AMMONIA,  in the last 168 hours CBC:  Recent Labs Lab 08/30/14 2310 08/31/14 0614 09/03/14 0950  WBC 11.5* 10.0 7.6  NEUTROABS 9.9*  --  4.1  HGB 15.1* 12.9 11.5*  HCT 43.0 37.5 34.3*  MCV 85.0 85.2 87.1  PLT PLATELET CLUMPS NOTED ON SMEAR, COUNT APPEARS ADEQUATE 201 247   Cardiac Enzymes: No results found for this basename: CKTOTAL, CKMB, CKMBINDEX, TROPONINI,  in the last 168 hours BNP: BNP (last 3 results) No results found for this basename: PROBNP,  in the last 8760 hours CBG: No results found for this basename: GLUCAP,  in the last 168 hours  Active Problems:   Calculus of gallbladder with acute cholecystitis   Time coordinating discharge: <30 mins  Signed:  Terelle Dobler, ANP-BC

## 2014-09-04 NOTE — Progress Notes (Signed)
AVS discharge instructions were given and went over with patient.  Patient was given prescriptions ultram, ibuprofen, and senokot to take to her pharmacy. Patient was also given a note from doctor for her employment. Patient stated that she did not have any questions. Volunteer assisted patient to her transportation.

## 2014-09-16 ENCOUNTER — Telehealth (INDEPENDENT_AMBULATORY_CARE_PROVIDER_SITE_OTHER): Payer: Self-pay | Admitting: Surgery

## 2014-09-16 NOTE — Telephone Encounter (Signed)
[name] had a lap chole by Dr. Andrey Campanile on 08/31/2014.  She has developed some nausea.  She is also having some cramps in her legs.  She is going to back off eating for a day or two to see if her nausea is better.  She will check with her primary doctor to see about her leg cramps.  Her follow up with Dr. Andrey Campanile is 10/6.  Ovidio Kin, MD, [facility] Pager: [phone] Office phone:  [phone]

## 2014-10-03 ENCOUNTER — Encounter: Payer: Self-pay | Admitting: Physician Assistant

## 2014-10-06 ENCOUNTER — Encounter: Payer: Self-pay | Admitting: Physician Assistant

## 2014-10-16 ENCOUNTER — Other Ambulatory Visit: Payer: Self-pay | Admitting: Internal Medicine

## 2014-10-17 ENCOUNTER — Other Ambulatory Visit: Payer: Self-pay | Admitting: Internal Medicine

## 2014-10-17 ENCOUNTER — Ambulatory Visit: Payer: BC Managed Care – PPO | Admitting: Physician Assistant

## 2014-10-17 DIAGNOSIS — I809 Phlebitis and thrombophlebitis of unspecified site: Secondary | ICD-10-CM

## 2014-10-20 ENCOUNTER — Ambulatory Visit
Admission: RE | Admit: 2014-10-20 | Discharge: 2014-10-20 | Disposition: A | Discharge status: Home / Self Care | Payer: BC Managed Care – PPO | Source: Ambulatory Visit | Attending: Internal Medicine | Admitting: Internal Medicine

## 2014-10-20 DIAGNOSIS — I809 Phlebitis and thrombophlebitis of unspecified site: Secondary | ICD-10-CM

## 2014-11-06 ENCOUNTER — Other Ambulatory Visit: Payer: Self-pay | Admitting: Gastroenterology

## 2014-11-06 DIAGNOSIS — R6881 Early satiety: Secondary | ICD-10-CM

## 2014-11-06 DIAGNOSIS — R1031 Right lower quadrant pain: Secondary | ICD-10-CM

## 2014-11-06 DIAGNOSIS — R110 Nausea: Secondary | ICD-10-CM

## 2014-11-13 ENCOUNTER — Ambulatory Visit
Admission: RE | Admit: 2014-11-13 | Discharge: 2014-11-13 | Disposition: A | Discharge status: Home / Self Care | Payer: BC Managed Care – PPO | Source: Ambulatory Visit | Attending: Gastroenterology | Admitting: Gastroenterology

## 2014-11-13 DIAGNOSIS — R110 Nausea: Secondary | ICD-10-CM

## 2014-11-13 DIAGNOSIS — R6881 Early satiety: Secondary | ICD-10-CM

## 2014-11-13 DIAGNOSIS — R1031 Right lower quadrant pain: Secondary | ICD-10-CM

## 2014-11-13 MED ORDER — IOHEXOL 300 MG/ML  SOLN
100.0000 mL | Freq: Once | INTRAMUSCULAR | Status: AC | PRN
  Administered 2014-11-13: 100 mL via INTRAVENOUS

## 2017-05-04 ENCOUNTER — Encounter: Payer: Self-pay | Admitting: Family Medicine

## 2017-05-04 ENCOUNTER — Ambulatory Visit (INDEPENDENT_AMBULATORY_CARE_PROVIDER_SITE_OTHER): Payer: 59 | Admitting: Family Medicine

## 2017-05-04 VITALS — BP 122/72 | HR 79 | Temp 98.4°F | Resp 12 | Ht 59.0 in | Wt 191.1 lb

## 2017-05-04 DIAGNOSIS — J309 Allergic rhinitis, unspecified: Secondary | ICD-10-CM | POA: Diagnosis not present

## 2017-05-04 DIAGNOSIS — E118 Type 2 diabetes mellitus with unspecified complications: Secondary | ICD-10-CM | POA: Diagnosis not present

## 2017-05-04 DIAGNOSIS — Z0000 Encounter for general adult medical examination without abnormal findings: Secondary | ICD-10-CM

## 2017-05-04 DIAGNOSIS — J3089 Other allergic rhinitis: Secondary | ICD-10-CM | POA: Insufficient documentation

## 2017-05-04 DIAGNOSIS — N898 Other specified noninflammatory disorders of vagina: Secondary | ICD-10-CM

## 2017-05-04 DIAGNOSIS — R059 Cough, unspecified: Secondary | ICD-10-CM

## 2017-05-04 DIAGNOSIS — E1169 Type 2 diabetes mellitus with other specified complication: Secondary | ICD-10-CM | POA: Insufficient documentation

## 2017-05-04 DIAGNOSIS — J45909 Unspecified asthma, uncomplicated: Secondary | ICD-10-CM | POA: Diagnosis not present

## 2017-05-04 DIAGNOSIS — E669 Obesity, unspecified: Secondary | ICD-10-CM | POA: Insufficient documentation

## 2017-05-04 DIAGNOSIS — M25512 Pain in left shoulder: Secondary | ICD-10-CM

## 2017-05-04 DIAGNOSIS — R05 Cough: Secondary | ICD-10-CM

## 2017-05-04 DIAGNOSIS — Z6838 Body mass index (BMI) 38.0-38.9, adult: Secondary | ICD-10-CM

## 2017-05-04 DIAGNOSIS — IMO0001 Reserved for inherently not codable concepts without codable children: Secondary | ICD-10-CM

## 2017-05-04 MED ORDER — ALBUTEROL SULFATE HFA 108 (90 BASE) MCG/ACT IN AERS: 2.0000 | INHALATION_SPRAY | Freq: Four times a day (QID) | RESPIRATORY_TRACT | 1 refills | Status: DC | PRN

## 2017-05-04 NOTE — Patient Instructions (Addendum)
WE NOW OFFER   [facility] FAST TRACK!!!  SAME DAY Appointments for ACUTE CARE  Such as: Sprains, Injuries, cuts, abrasions, rashes, muscle pain, joint pain, back pain Colds, flu, sore throats, headache, allergies, cough, fever  Ear pain, sinus and eye infections Abdominal pain, nausea, vomiting, diarrhea, upset stomach Animal/insect bites  3 Easy Ways to Schedule: Walk-In Scheduling Call in scheduling Mychart Sign-up: [email or web address]  A few things to remember from today's visit:   Left shoulder pain, unspecified chronicity  Vaginal discharge  Cough  Allergic rhinitis, unspecified seasonality, unspecified trigger  Type 2 diabetes mellitus with complication, without long-term current use of insulin (HCC) - Plan: Hemoglobin A1c, Basic metabolic panel, Fructosamine  [id number] goal < 7.0. Avoid sugar added food:regular soft drinks, energy drinks, and sports drinks. candy. cakes. cookies. pies and cobblers. sweet rolls, pastries, and donuts. fruit drinks, such as fruitades and fruit punch. dairy desserts, such as ice cream  Mediterranean diet has showed benefits for sugar control.  How much and what type of carbohydrate foods are important for managing diabetes. The balance between how much insulin is in your body and the carbohydrate you eat makes a difference in your blood glucose levels.  Fasting blood sugar ideally 130 or less, 2 hours after meals less than 180.   Regular exercise also will help with controlling disease, daily brisk walking as tolerated for 15-30 min definitively will help.   Avoid skipping meals, blood sugar might drop and cause serious problems. Remember checking feet periodically, good dental hygiene, and annual eye exam.    Please be sure medication list is accurate. If a new problem present, please set up appointment sooner than planned today.

## 2017-05-04 NOTE — Progress Notes (Signed)
HPI:   [name] is a 51 y.o. female, who is here today to establish care.  Former PCP: Dr Mardelle Matte Last preventive routine visit: Not one in many years.  Chronic medical problems: Allergies,constipation, migraine headaches,lower back pain with radicular pain (RLE), fatty liver,meralgia paraesthetica (right), and left shoulder pain.  Hx of OSA a few years ago. According to pt, problem improved with wt loss. She denies fatigue or louder snoring.   Concerns today:   Cough:  Intermittent episodes of dry cough, with occasional wheezing. She reports Hx of allergies and exposure to asbestos years ago. She has not noted exacerbating factors, it is alleviated by drinking sips of water.  She denies chest pain or dyspnea. According to patient, in the past she has had albuterol inhaler to use as needed, it helped with cough. She denies heartburn, history of GERD, she is on omeprazole 40 mg daily as needed.  She denies abnormal wt loss or hemoptysis. Denies fever,chills, or recent sick exposure.   Left shoulder pain: She is also concerned because her left arm looks "bigger" than her right arm. She noticed this about a week ago. She is right-handed but tells me that she usually caring groceries with her left upper extremity.  She denies associated pain or erythema.  She is also having lateral shoulder pain, which is chronic, exacerbated by movement and alleviated by rest. She follows with orthopedist as needed. She had a fall at work, landed on her left side, this happened about 2 days ago, it did not seem to aggravate shoulder pain, she has not noted deformity or changes in ROM. She denies head trauma.  She denies LUE numbness or tingling.  She is also reporting Hx of lower back pain with "sciatic" but according to patient, she has not had symptoms in a while.  Vaginal discharge:  10 days of yellowish ,not odorous vaginal discharge. She mentions that she is going through  divorce, she denies Hx of STD's and denies new sex partners, not aware of exposure to STD's. She used OTC Monistat but did not seem to help.  Vaginal discharge is intermittent, no exacerbating or alleviated factors identified. No pruritus, dyspareunia,or pelvic pain. LMP 06/2013.  -She mentions that she had a HgA1C 7.1 a few years ago, 2016 but with wt loss it improved ,so medication was not recommended. She has not been consistent with exercising regularly, she thinks her diet is otherwise healthy but has gained some wt in the past few months. She attributes wt gain to stress at work.She eats at night and goes to be right after.    Review of Systems  Constitutional: Negative for activity change, appetite change, fatigue, fever and unexpected weight change.  HENT: Negative for mouth sores, nosebleeds, sore throat and trouble swallowing.   Eyes: Negative for redness and visual disturbance.  Respiratory: Positive for cough and wheezing. Negative for shortness of breath.   Cardiovascular: Negative for chest pain, palpitations and leg swelling.  Gastrointestinal: Negative for abdominal pain, blood in stool, nausea and vomiting.       Negative for changes in bowel habits.  Endocrine: Negative for polydipsia, polyphagia and polyuria.  Genitourinary: Positive for vaginal discharge. Negative for decreased urine volume, dysuria, hematuria, pelvic pain and vaginal bleeding.  Musculoskeletal: Positive for arthralgias and myalgias. Negative for gait problem.  Skin: Negative for rash and wound.  Allergic/Immunologic: Positive for environmental allergies.  Neurological: Negative for syncope, weakness and headaches.  Hematological: Negative for adenopathy.  Does not bruise/bleed easily.  Psychiatric/Behavioral: Negative for confusion and sleep disturbance. The patient is not nervous/anxious.       Current Outpatient Prescriptions on File Prior to Visit  Medication Sig Dispense Refill  .  acetaminophen (TYLENOL) 325 MG tablet Take 2 tablets (650 mg total) by mouth every 4 (four) hours as needed for fever (Temp > 101.0).    . fluticasone (FLONASE) 50 MCG/ACT nasal spray Place 2 sprays into both nostrils daily.    Marland Kitchen. omeprazole (PRILOSEC) 40 MG capsule Take 40 mg by mouth daily.    . SUMAtriptan (IMITREX) 100 MG tablet Take 100 mg by mouth every 2 (two) hours as needed for migraine or headache. May repeat in 2 hours if headache persists or recurs.     No current facility-administered medications on file prior to visit.      Past Medical History:  Diagnosis Date  . Allergy   . Arthritis   . Diabetes mellitus without complication (HCC)    2016  . Family history of anesthesia complication    "Mama did; she has alzheimer's; any anesthesia puts her in the twilight zone"  . Gallstones   . GERD (gastroesophageal reflux disease)   . Migraine    "cluster migraine"  . Sciatic nerve injury    "right side"   Past Surgical History Patient has a past surgical history that includes Tonsillectomy; Cholecystectomy; Anterior cruciate ligament repair (Right, 1996); and Knee arthroscopy w/ ACL reconstruction (1998).  Allergies  Allergen Reactions  . Citrus Hives  . Penicillins Hives  . Vicodin [Hydrocodone-Acetaminophen] Hives    Family History  Problem Relation Age of Onset  . Dementia Mother   . Cancer Father        prostate  . Diabetes Daughter   . Hyperlipidemia Daughter   . Hypertension Daughter     Social History   Social History  . Marital status: Divorced    Spouse name: N/A  . Number of children: N/A  . Years of education: N/A   Social History Main Topics  . Smoking status: Never Smoker  . Smokeless tobacco: Never Used  . Alcohol use No  . Drug use: No  . Sexual activity: Yes   Other Topics Concern  . None   Social History Narrative  . None    Vitals:   05/04/17 1416  BP: 122/72  Pulse: 79  Resp: 12  Temp: 98.4 F (36.9 C)   O2 sat at RA  96% Body mass index is 38.6 kg/m.   Physical Exam  Nursing note and vitals reviewed. Constitutional: She is oriented to person, place, and time. She appears well-developed. No distress.  HENT:  Head: Atraumatic.  Mouth/Throat: Oropharynx is clear and moist and mucous membranes are normal.  Eyes: Conjunctivae and EOM are normal. Pupils are equal, round, and reactive to light.  Neck: No tracheal deviation present. No thyroid mass and no thyromegaly present.  Cardiovascular: Normal rate and regular rhythm.   No murmur heard. Pulses:      Dorsalis pedis pulses are 2+ on the right side, and 2+ on the left side.  Respiratory: Effort normal and breath sounds normal. No respiratory distress.  GI: Soft. She exhibits no mass. There is no hepatomegaly. There is no tenderness.  Genitourinary: There is no tenderness or lesion on the right labia. There is no tenderness or lesion on the left labia. No erythema, tenderness or bleeding in the vagina. Vaginal discharge found.  Musculoskeletal: She exhibits no edema or deformity.  Cervical back: She exhibits no tenderness and no bony tenderness.       Thoracic back: She exhibits no tenderness and no bony tenderness.  Left shoulder: No deformity, edema, or erythema appreciated.No muscle atrophy. Juanetta Gosling' test elicits mild pain, drop arm rotator cuff testneg, empty can supraspinatus test neg, cross body adduction test neg, lift-Off Subscapularis test neg. ROM mildly limited active abduction, elicits pain.  -Left UE, mildly hypertrophic, noted when abducting extremities and mainly proximally when compared with RUE. No edema or erythema, no deformity.  Lymphadenopathy:    She has no cervical adenopathy.    She has no axillary adenopathy.  Neurological: She is alert and oriented to person, place, and time. She has normal strength. Gait normal.  Skin: Skin is warm. No rash noted. No erythema.  Psychiatric: Her mood appears anxious.  Well groomed, good  eye contact.    Diabetic foot exam:  Monofilament normal bilateral. Peripheral pulses present (DP). No calluses No hypertrophic/long toenails.   ASSESSMENT AND PLAN:   Keryn was seen today for establish care.  Diagnoses and all orders for this visit:  Left shoulder pain, unspecified chronicity  Chronic. ROM exercises recommended for now. OTC NSAID's, some side effects discussed. She follows with ortho as needed, so recommended arranging appt if not better in a couple of weeks or if symptoms are worse.  Vaginal discharge  We discussed possible etiologies. It does not seem to be fungal. Further recommendations will be given according to lab results.   -     WET PREP FOR TRICH, YEAST, CLUE; -     GC/Chlamydia Probe Amp; Future   Mild reactive airways disease, unspecified whether persistent   Today lung auscultation normal. Albuterol inh 2 puff every 6 hours for a week then as needed for wheezing or shortness of breath.   -     albuterol (PROVENTIL HFA;VENTOLIN HFA) 108 (90 Base) MCG/ACT inhaler; Inhale 2 puffs into the lungs every 6 (six) hours as needed for wheezing or shortness of breath.  Cough  ? s/p recent URI, asthma, allergies,and GERD among some possible causes discussed.  Allergic rhinitis, unspecified seasonality, unspecified trigger  OTC antihistaminic recommended, Allegra or Zyrtec. Intranasal steroid daily as needed. Nasal irrigations with saline prn. F/U as needed.  Type 2 diabetes mellitus with complication, without long-term current use of insulin (HCC)  HgA1C still pending. Further recommendations will be given according to lab results.She is not interested in medication. She could not collect urine sample. Regular exercise and healthy diet with avoidance of added sugar food intake is an important part of treatment and recommended. Annual eye exam, periodic dental and foot care recommended.She has appt with her eye care provider in 2  weeks. F/U in 4 months  -     Hemoglobin A1c -     Basic metabolic panel -     Fructosamine  Class 2 obesity with serious comorbidity and body mass index (BMI) of 38.0 to 38.9 in adult, unspecified obesity type   We discussed benefits of wt loss as well as adverse effects of obesity. Consistency with healthy diet and physical activity recommended. Daily brisk walking for 15-30 min as tolerated.   Healthcare maintenance  She is overdue for mammogram, refused referral. Needs pap smear, recommended arranging appt. Colonoscopy 03/14/15. She is not interested in vaccines, needs Pneumovax.    Sharbel Sahagun G. [place], MD  [facility]. [place] office.

## 2017-05-05 ENCOUNTER — Telehealth: Payer: Self-pay | Admitting: Family Medicine

## 2017-05-05 LAB — BASIC METABOLIC PANEL
BUN: 14 mg/dL (ref 6–23)
CALCIUM: 10 mg/dL (ref 8.4–10.5)
CO2: 27 mEq/L (ref 19–32)
CREATININE: 0.86 mg/dL (ref 0.40–1.20)
Chloride: 102 mEq/L (ref 96–112)
GFR: 89.05 mL/min (ref >60.00)
GLUCOSE: 111 mg/dL — AB (ref 70–99)
Potassium: 4.3 mEq/L (ref 3.5–5.1)
Sodium: 139 mEq/L (ref 135–145)

## 2017-05-05 LAB — GC/CHLAMYDIA PROBE AMP
CT Probe RNA: NOT DETECTED
GC Probe RNA: NOT DETECTED

## 2017-05-05 LAB — WET PREP BY MOLECULAR PROBE
Candida species: NOT DETECTED
Gardnerella vaginalis: DETECTED — AB
Trichomonas vaginosis: NOT DETECTED

## 2017-05-05 LAB — HEMOGLOBIN A1C: Hgb A1c MFr Bld: 8.1 % — ABNORMAL HIGH (ref 4.6–6.5)

## 2017-05-05 NOTE — Telephone Encounter (Signed)
Pt would like blood work and wet prep results. Pt is aware md out of office today

## 2017-05-06 ENCOUNTER — Encounter: Payer: Self-pay | Admitting: Family Medicine

## 2017-05-08 ENCOUNTER — Telehealth: Payer: Self-pay | Admitting: Family Medicine

## 2017-05-08 ENCOUNTER — Other Ambulatory Visit: Payer: Self-pay | Admitting: Family Medicine

## 2017-05-08 DIAGNOSIS — N760 Acute vaginitis: Secondary | ICD-10-CM

## 2017-05-08 DIAGNOSIS — B9689 Other specified bacterial agents as the cause of diseases classified elsewhere: Secondary | ICD-10-CM

## 2017-05-08 DIAGNOSIS — J45909 Unspecified asthma, uncomplicated: Secondary | ICD-10-CM

## 2017-05-08 LAB — FRUCTOSAMINE: Fructosamine: 254 umol/L (ref 190–270)

## 2017-05-08 MED ORDER — ALBUTEROL SULFATE HFA 108 (90 BASE) MCG/ACT IN AERS: 2.0000 | INHALATION_SPRAY | Freq: Four times a day (QID) | RESPIRATORY_TRACT | 1 refills | Status: DC | PRN

## 2017-05-08 MED ORDER — METRONIDAZOLE 0.75 % VA GEL: 1.0000 | Freq: Every day | VAGINAL | 0 refills | Status: AC

## 2017-05-08 MED ORDER — METRONIDAZOLE 0.75 % VA GEL: 1.0000 | Freq: Every day | VAGINAL | 0 refills | Status: DC

## 2017-05-08 NOTE — Telephone Encounter (Signed)
Rx sent 

## 2017-05-08 NOTE — Telephone Encounter (Signed)
I already sent the albuterol, please send the cream.   Thank you!

## 2017-05-08 NOTE — Addendum Note (Signed)
Addended by: Marcell AngerSELF, Brinda Focht E on: 05/08/2017 03:06 PM   Modules accepted: Orders

## 2017-05-08 NOTE — Telephone Encounter (Signed)
Pt would like to have her Rx's (albuterol and cream) [facility] [address]

## 2017-05-19 ENCOUNTER — Telehealth: Payer: Self-pay | Admitting: Family Medicine

## 2017-05-19 NOTE — Telephone Encounter (Signed)
Pt states she finished the  metroNIDAZOLE (METROGEL) 0.75 % vaginal gel  on Monday night and is still having some symptoms. Pt still having discharge.  She thinks she may need another round of the med in order to get rid of this She doesn't have any more applicators, has a little bit of this med left in the tube.   [facility] [address] - [place], [place] - [address]

## 2017-05-23 ENCOUNTER — Encounter: Payer: Self-pay | Admitting: Family Medicine

## 2017-05-24 MED ORDER — METRONIDAZOLE 500 MG PO TABS: 500.0000 mg | ORAL_TABLET | Freq: Two times a day (BID) | ORAL | 0 refills | Status: DC

## 2017-05-24 NOTE — Telephone Encounter (Signed)
Rx sent patient is aware 

## 2017-05-24 NOTE — Telephone Encounter (Signed)
If she wants she can try oral Metronidazole this time, 500 mg bid for 7 days. She can not drink alcohol while taking medications.  Thanks, BJ

## 2017-05-24 NOTE — Telephone Encounter (Signed)
Please advise 

## 2017-05-24 NOTE — Telephone Encounter (Signed)
Pt calling to check the status she state that she is having some discharge and would like to know what she should do about it or if Dr. SwazilandJordan would give her another round of medication.

## 2017-05-26 ENCOUNTER — Telehealth: Payer: Self-pay | Admitting: Family Medicine

## 2017-05-26 MED ORDER — CLINDAMYCIN PHOSPHATE 100 MG VA SUPP: 100.0000 mg | Freq: Every day | VAGINAL | 0 refills | Status: DC

## 2017-05-26 NOTE — Telephone Encounter (Signed)
Spoke with pt and advised. She would like to try the Clindamycin. Rx sent to pharmacy. Pt advised to contact office if any adverse side effects. Pt also advised if this does not clear symptoms she will need Gyn referral. Pt agrees. Nothing further needed at this time.

## 2017-05-26 NOTE — Telephone Encounter (Signed)
Pt states she has starting itching all over.  She thinks it may be from the metroNIDAZOLE (FLAGYL) 500 MG tablet  Pt states she read on line that it is rare, but this med can cause itching  Pt wants to know if ok to take benadryl for this or what should she do?  [facility] [address] - , [place] - [id number]

## 2017-05-26 NOTE — Telephone Encounter (Signed)
Pt is having possible allergic reaction to Flagyl with diffuse itching and rash. She would like to know what else you could send in for her.   Would Hylafem work? Dr. SwazilandJordan - Please advise. Thanks!

## 2017-05-26 NOTE — Telephone Encounter (Signed)
Hylafen may help to prevent future episodes of BV, it keeps vaginal PH acidic; but not for treatment. Recommended treatments are similar to Metronidazole but sometimes vaginal Clindamycin intravaginal suppositories for 3 days is recommended if contraindications for Metronidazole. If she decides to do Clindamycin, it is 100 mg vaginal suppositories to place intravaginal x 3 nights at bedtime.  If vaginal discharge is persistent or she has a side effects with this medication I will recommend gyn evaluation.  Thanks, BJ

## 2017-05-31 ENCOUNTER — Telehealth: Payer: Self-pay | Admitting: Family Medicine

## 2017-05-31 DIAGNOSIS — J45909 Unspecified asthma, uncomplicated: Secondary | ICD-10-CM

## 2017-05-31 MED ORDER — ALBUTEROL SULFATE HFA 108 (90 BASE) MCG/ACT IN AERS: 2.0000 | INHALATION_SPRAY | Freq: Four times a day (QID) | RESPIRATORY_TRACT | 2 refills | Status: DC | PRN

## 2017-05-31 NOTE — Telephone Encounter (Signed)
Rx sent 

## 2017-05-31 NOTE — Telephone Encounter (Signed)
PT NEED new rx proventil inhaler #3 w/refills [facility] [phone]

## 2017-09-01 ENCOUNTER — Ambulatory Visit: Payer: 59 | Admitting: Family Medicine

## 2017-09-14 ENCOUNTER — Encounter: Payer: Self-pay | Admitting: Family Medicine

## 2018-12-20 ENCOUNTER — Encounter (HOSPITAL_COMMUNITY): Payer: Self-pay

## 2018-12-20 ENCOUNTER — Other Ambulatory Visit: Payer: Self-pay

## 2018-12-20 ENCOUNTER — Emergency Department (HOSPITAL_COMMUNITY)
Admission: EM | Admit: 2018-12-20 | Discharge: 2018-12-21 | Disposition: A | Discharge status: Home / Self Care | Payer: Self-pay | Attending: Emergency Medicine | Admitting: Emergency Medicine

## 2018-12-20 DIAGNOSIS — R1110 Vomiting, unspecified: Secondary | ICD-10-CM | POA: Insufficient documentation

## 2018-12-20 DIAGNOSIS — J0100 Acute maxillary sinusitis, unspecified: Secondary | ICD-10-CM | POA: Insufficient documentation

## 2018-12-20 DIAGNOSIS — G44019 Episodic cluster headache, not intractable: Secondary | ICD-10-CM | POA: Insufficient documentation

## 2018-12-20 DIAGNOSIS — R0982 Postnasal drip: Secondary | ICD-10-CM | POA: Insufficient documentation

## 2018-12-20 DIAGNOSIS — E119 Type 2 diabetes mellitus without complications: Secondary | ICD-10-CM | POA: Insufficient documentation

## 2018-12-20 NOTE — ED Triage Notes (Signed)
Per [facility], pt from a friend's house w/ a c/o a "cluster headache." Pt stated she was in her car when she had a sudden onset of a headache to her right temporal region. She went to her friend's house and had N/V/D. Two episodes of diarrhea and one episode of vomiting. Her HA lasted for ~2.5 hrs. She is no longer experiencing pain. Her last cluster HA was 08/11/2016. Additional complaints of sinus drainage and congestion for 1 week.    Dr. Clarene DukeLittle is her neurologist.   EMS v/s  190/102 --> 164/80 HR 80s CBG 159

## 2018-12-21 ENCOUNTER — Encounter (HOSPITAL_COMMUNITY): Payer: Self-pay | Admitting: Emergency Medicine

## 2018-12-21 ENCOUNTER — Other Ambulatory Visit: Payer: Self-pay

## 2018-12-21 ENCOUNTER — Emergency Department (HOSPITAL_COMMUNITY)
Admission: EM | Admit: 2018-12-21 | Discharge: 2018-12-22 | Disposition: A | Discharge status: Home / Self Care | Payer: Self-pay | Attending: Emergency Medicine | Admitting: Emergency Medicine

## 2018-12-21 DIAGNOSIS — G44009 Cluster headache syndrome, unspecified, not intractable: Secondary | ICD-10-CM | POA: Insufficient documentation

## 2018-12-21 DIAGNOSIS — E119 Type 2 diabetes mellitus without complications: Secondary | ICD-10-CM | POA: Insufficient documentation

## 2018-12-21 MED ORDER — AZITHROMYCIN 250 MG PO TABS: 250.0000 mg | ORAL_TABLET | Freq: Every day | ORAL | 0 refills | Status: DC

## 2018-12-21 NOTE — ED Triage Notes (Signed)
Pt BIB [facility], c/o cluster headache hx same. Pt seen last night for headache and sinus infection, started on zpak and told to follow-up with neurology. A&O x 4, sensitive to light.

## 2018-12-21 NOTE — ED Provider Notes (Signed)
[facility]   CSN: [id number] Arrival date & time: 12/20/18  2135     History   Chief Complaint Chief Complaint  Patient presents with  . Migraine    HPI [name] is a 52 y.o. female with a hx of allergy, arthritis, GERD, cluster migraine headaches presents to the Emergency Department complaining of gradual, now resolved headache onset 7:30pm.  Pt reports hx of cluster migraines since the age of 52 with her last being Aug 2017.  Pt reports she had a cluster migraine tonight with right sided pain in her head and face, eye watering and vomiting.  Pt reports the headache tonight was the same as previous headaches.  She used some peppermint oil and BC powder without relief.  Pt reports she then had several episodes of NBNB emesis as well.  She reports this lasted several hours and has totally resolved at this time.   Pt reports sinus pressure x1 week and a cough.  Pt reports she has been taking Tussin DM for the cough. Associated symptoms include nasal congestion, decreased appetite, cough, post nasal drip, fatigue.  She reports fever of 101 Monday, but this resolved and has not returned.  No aggravating factors.  Pt reports she was hypertensive upon EMS arrival.  She denies a hx of HTN.    The history is provided by the patient and medical records. No language interpreter was used.    Past Medical History:  Diagnosis Date  . Allergy   . Arthritis   . Diabetes mellitus without complication (HCC)    2016  . Family history of anesthesia complication    "Mama did; she has alzheimer's; any anesthesia puts her in the twilight zone"  . Gallstones   . GERD (gastroesophageal reflux disease)   . Migraine    "cluster migraine"  . Sciatic nerve injury    "right side"    Patient Active Problem List   Diagnosis Date Noted  . Allergic rhinitis 05/04/2017  . Diabetes mellitus, type II (HCC) 05/04/2017  . Class 2 obesity with serious  comorbidity and body mass index (BMI) of 38.0 to 38.9 in adult 05/04/2017  . Calculus of gallbladder with acute cholecystitis 08/31/2014    Past Surgical History:  Procedure Laterality Date  . ANTERIOR CRUCIATE LIGAMENT REPAIR Right 1996  . CHOLECYSTECTOMY N/A 08/31/2014   Procedure: LAPAROSCOPIC CHOLECYSTECTOMY WITH INTRAOPERATIVE CHOLANGIOGRAM;  Surgeon: Gaynelle AduEric Wilson, MD;  [facility];  Service: General;  Laterality: N/A;  . KNEE ARTHROSCOPY Right 1998   with ACL reconstruction  . LAPAROSCOPIC CHOLECYSTECTOMY  08/31/2014  . TONSILLECTOMY  ~ 1978     OB History   No obstetric history on file.      Home Medications    Prior to Admission medications   Medication Sig Start Date End Date Taking? Authorizing Provider  acetaminophen (TYLENOL) 325 MG tablet Take 2 tablets (650 mg total) by mouth every 4 (four) hours as needed for fever (Temp > 101.0). Patient not taking: Reported on 12/20/2018 09/04/14   Riebock, Anette RiedelEmina, NP  albuterol (PROVENTIL HFA;VENTOLIN HFA) 108 (90 Base) MCG/ACT inhaler Inhale 2 puffs into the lungs every 6 (six) hours as needed for wheezing or shortness of breath. Patient not taking: Reported on 12/20/2018 05/31/17   [place]il[place]Jordan, Betty G, MD  azithromycin (ZITHROMAX) 250 MG tablet Take 1 tablet (250 mg total) by mouth daily. Take first 2 tablets together, then 1 every day until finished. 12/21/18   Nnamdi Dacus, Dahlia ClientHannah,  PA-C  clindamycin (CLEOCIN) 100 MG vaginal suppository Place 1 suppository (100 mg total) vaginally at bedtime. Patient not taking: Reported on 12/20/2018 05/26/17   [place]Jordan, Betty G, MD  metroNIDAZOLE (FLAGYL) 500 MG tablet Take 1 tablet (500 mg total) by mouth 2 (two) times daily. Patient not taking: Reported on 12/20/2018 05/24/17   [place]andJordan, Betty G, MD    Family History Family History  Problem Relation Age of Onset  . Dementia Mother   . Cancer Father        prostate  . Diabetes Daughter   . Hyperlipidemia Daughter   . Hypertension Daughter      Social History Social History   Tobacco Use  . Smoking status: Never Smoker  . Smokeless tobacco: Never Used  Substance Use Topics  . Alcohol use: No  . Drug use: No     Allergies   Citrus; Penicillins; and Vicodin [hydrocodone-acetaminophen]   Review of Systems Review of Systems  Constitutional: Positive for fatigue. Negative for appetite change, diaphoresis, fever and unexpected weight change.  HENT: Positive for congestion, postnasal drip, rhinorrhea, sinus pressure, sinus pain and sore throat. Negative for mouth sores.   Eyes: Negative for visual disturbance.  Respiratory: Positive for cough. Negative for chest tightness, shortness of breath and wheezing.   Cardiovascular: Negative for chest pain.  Gastrointestinal: Negative for abdominal pain, constipation, diarrhea, nausea and vomiting.  Endocrine: Negative for polydipsia, polyphagia and polyuria.  Genitourinary: Negative for dysuria, frequency, hematuria and urgency.  Musculoskeletal: Negative for back pain and neck stiffness.  Skin: Negative for rash.  Allergic/Immunologic: Negative for immunocompromised state.  Neurological: Positive for headaches. Negative for syncope and light-headedness.  Hematological: Does not bruise/bleed easily.  Psychiatric/Behavioral: Negative for sleep disturbance. The patient is not nervous/anxious.      Physical Exam Updated Vital Signs BP 133/74 (BP Location: Right Arm)   Pulse 70   Temp 98.5 F (36.9 C) (Oral)   Ht 4\' 11"  (1.499 m)   Wt 83.9 kg   SpO2 100%   BMI 37.37 kg/m   Physical Exam Vitals signs and nursing note reviewed.  Constitutional:      General: She is not in acute distress.    Appearance: She is well-developed. She is not diaphoretic.  HENT:     Head: Normocephalic and atraumatic.     Right Ear: Tympanic membrane, ear canal and external ear normal.     Left Ear: Tympanic membrane, ear canal and external ear normal.     Nose: Mucosal edema and  rhinorrhea present.     Right Sinus: Maxillary sinus tenderness and frontal sinus tenderness present.     Left Sinus: No maxillary sinus tenderness or frontal sinus tenderness.     Mouth/Throat:     Mouth: Mucous membranes are not pale and not cyanotic.     Pharynx: Uvula midline. No oropharyngeal exudate or posterior oropharyngeal erythema.     Tonsils: No tonsillar abscesses.  Eyes:     General: No scleral icterus.    Conjunctiva/sclera: Conjunctivae normal.     Pupils: Pupils are equal, round, and reactive to light.     Comments: No horizontal, vertical or rotational nystagmus  Neck:     Musculoskeletal: Full passive range of motion without pain, normal range of motion and neck supple.     Comments: Full active and passive ROM without pain No midline or paraspinal tenderness No nuchal rigidity or meningeal signs Cardiovascular:     Rate and Rhythm: Normal rate and regular rhythm.  Pulmonary:     Effort: Pulmonary effort is normal. No respiratory distress.     Breath sounds: Normal breath sounds. No stridor. No wheezing or rales.  Abdominal:     General: Bowel sounds are normal.     Palpations: Abdomen is soft.     Tenderness: There is no abdominal tenderness. There is no guarding or rebound.  Musculoskeletal: Normal range of motion.  Lymphadenopathy:     Cervical: No cervical adenopathy.  Skin:    General: Skin is warm and dry.     Findings: No rash.  Neurological:     Mental Status: She is alert and oriented to person, place, and time.     Cranial Nerves: No cranial nerve deficit.     Motor: No abnormal muscle tone.     Coordination: Coordination normal.     Comments: Mental Status:  Alert, oriented, thought content appropriate. Speech fluent without evidence of aphasia. Able to follow 2 step commands without difficulty.  Cranial Nerves:  II:  Peripheral visual fields grossly normal, pupils equal, round, reactive to light III,IV, VI: ptosis not present, extra-ocular  motions intact bilaterally  V,VII: smile symmetric, facial light touch sensation equal VIII: hearing grossly normal bilaterally  IX,X: midline uvula rise  XI: bilateral shoulder shrug equal and strong XII: midline tongue extension  Motor:  5/5 in upper and lower extremities bilaterally including strong and equal grip strength and dorsiflexion/plantar flexion Sensory: Pinprick and light touch normal in all extremities.  Cerebellar: normal finger-to-nose with bilateral upper extremities Gait: normal gait and balance CV: distal pulses palpable throughout   Psychiatric:        Behavior: Behavior normal.        Thought Content: Thought content normal.        Judgment: Judgment normal.      ED Treatments / Results   Procedures Procedures (including critical care time)  Medications Ordered in ED Medications - No data to display   Initial Impression / Assessment and Plan / ED Course  I have reviewed the triage vital signs and the nursing notes.  Pertinent labs & imaging results that were available during my care of the patient were reviewed by me and considered in my medical decision making (see chart for details).     Pt HA resolved while in the ED. Presentation is like pts typical HA and non concerning for [facility], ICH, Meningitis, or temporal arteritis. Pt is afebrile with no focal neuro deficits, nuchal rigidity, or change in vision. moderate symptoms of clear/yellow nasal discharge/congestion and scratchy throat with cough for 7-10 days.  Patient is afebrile.  Unilateral sinus tenderness. Will give Azithromycin as pt has PCN allergy. Pt is to follow up with PCP to discuss prophylactic medication. Pt verbalizes understanding and is agreeable with plan to dc.    Final Clinical Impressions(s) / ED Diagnoses   Final diagnoses:  Episodic cluster headache, not intractable  Acute maxillary sinusitis, recurrence not specified    ED Discharge Orders         Ordered    azithromycin  (ZITHROMAX) 250 MG tablet  Daily     12/21/18 0041           Aashka Salomone, Dahlia Client, PA-C 12/21/18 0042    Shaune Pollack, MD 12/21/18 1931

## 2018-12-21 NOTE — Discharge Instructions (Signed)
1. Medications: Azithromycin, alternate tylenol and ibuprofen for fever control, usual home medications 2. Treatment: rest, drink plenty of fluids,  3. Follow Up: Please followup with your primary doctor in 2 days for discussion of your diagnoses and further evaluation after today's visit; if you do not have a primary care doctor use the resource guide provided to find one; Please return to the ER for syncope, difficulty breathing, persistent high fevers, intractable vomiting or other concerns

## 2018-12-22 MED ORDER — SODIUM CHLORIDE 0.9 % IV BOLUS
500.0000 mL | Freq: Once | INTRAVENOUS | Status: AC
Start: 2018-12-22 — End: 2018-12-22
  Administered 2018-12-22: 500 mL via INTRAVENOUS

## 2018-12-22 MED ORDER — PROMETHAZINE HCL 25 MG/ML IJ SOLN
12.5000 mg | Freq: Once | INTRAMUSCULAR | Status: AC
  Administered 2018-12-22: 12.5 mg via INTRAVENOUS
  Filled 2018-12-22: qty 1

## 2018-12-22 MED ORDER — KETOROLAC TROMETHAMINE 30 MG/ML IJ SOLN
15.0000 mg | Freq: Once | INTRAMUSCULAR | Status: AC
Start: 2018-12-22 — End: 2018-12-22
  Administered 2018-12-22: 15 mg via INTRAVENOUS
  Filled 2018-12-22: qty 1

## 2018-12-22 NOTE — Discharge Instructions (Signed)
Follow-up with your primary care doctor or your neurologist for your recurrent headaches.

## 2018-12-22 NOTE — ED Notes (Signed)
Patient verbalizes understanding of discharge instructions. Opportunity for questioning and answers were provided. Armband removed by staff, pt discharged from ED. Wheeled out to lobby  

## 2018-12-22 NOTE — ED Notes (Signed)
Pt states that she was diagnosed with cluster migraines

## 2018-12-22 NOTE — ED Provider Notes (Signed)
[facility] Provider Note   CSN: [id number] Arrival date & time: 12/21/18  1854     History   Chief Complaint Chief Complaint  Patient presents with  . Headache    HPI [name] is a 52 y.o. female.  HPI Patient presents with headache.  It is on the right side of her head involving her eyes and moving back.  History of cluster headaches.  States this is a cluster.  Had an episode yesterday..  Seen for same.  Headache improved at that time but came back.  Also had episodes a few days ago.  That resolved.  Has had URI symptoms with cough and some sinus congestion.  Started on azithromycin yesterday.  No fevers.  Right eye tearing.  History of cluster headaches but been doing well over the last couple years. Past Medical History:  Diagnosis Date  . Allergy   . Arthritis   . Diabetes mellitus without complication (HCC)    2016  . Family history of anesthesia complication    "Mama did; she has alzheimer's; any anesthesia puts her in the twilight zone"  . Gallstones   . GERD (gastroesophageal reflux disease)   . Migraine    "cluster migraine"  . Sciatic nerve injury    "right side"    Patient Active Problem List   Diagnosis Date Noted  . Allergic rhinitis 05/04/2017  . Diabetes mellitus, type II (HCC) 05/04/2017  . Class 2 obesity with serious comorbidity and body mass index (BMI) of 38.0 to 38.9 in adult 05/04/2017  . Calculus of gallbladder with acute cholecystitis 08/31/2014    Past Surgical History:  Procedure Laterality Date  . ANTERIOR CRUCIATE LIGAMENT REPAIR Right 1996  . CHOLECYSTECTOMY N/A 08/31/2014   Procedure: LAPAROSCOPIC CHOLECYSTECTOMY WITH INTRAOPERATIVE CHOLANGIOGRAM;  Surgeon: Gaynelle AduEric Wilson, MD;  [facility];  Service: General;  Laterality: N/A;  . KNEE ARTHROSCOPY Right 1998   with ACL reconstruction  . LAPAROSCOPIC CHOLECYSTECTOMY  08/31/2014  . TONSILLECTOMY  ~ 1978     OB History   No obstetric history  on file.      Home Medications    Prior to Admission medications   Medication Sig Start Date End Date Taking? Authorizing Provider  azithromycin (ZITHROMAX) 250 MG tablet Take 1 tablet (250 mg total) by mouth daily. Take first 2 tablets together, then 1 every day until finished. Patient taking differently: Take 250-500 mg by mouth See admin instructions. Take 2 tablets on day 1 , then take 1 tablet every day until finished. 12/21/18  Yes Muthersbaugh, Dahlia ClientHannah, PA-C  acetaminophen (TYLENOL) 325 MG tablet Take 2 tablets (650 mg total) by mouth every 4 (four) hours as needed for fever (Temp > 101.0). Patient not taking: Reported on 12/20/2018 09/04/14   Riebock, Anette RiedelEmina, NP  albuterol (PROVENTIL HFA;VENTOLIN HFA) 108 (90 Base) MCG/ACT inhaler Inhale 2 puffs into the lungs every 6 (six) hours as needed for wheezing or shortness of breath. Patient not taking: Reported on 12/20/2018 05/31/17   [place], Betty G, MD  clindamycin (CLEOCIN) 100 MG vaginal suppository Place 1 suppository (100 mg total) vaginally at bedtime. Patient not taking: Reported on 12/20/2018 05/26/17   [place], Betty G, MD  metroNIDAZOLE (FLAGYL) 500 MG tablet Take 1 tablet (500 mg total) by mouth 2 (two) times daily. Patient not taking: Reported on 12/20/2018 05/24/17   [place], Betty G, MD    Family History Family History  Problem Relation Age of Onset  . Dementia Mother   .  Cancer Father        prostate  . Diabetes Daughter   . Hyperlipidemia Daughter   . Hypertension Daughter     Social History Social History   Tobacco Use  . Smoking status: Never Smoker  . Smokeless tobacco: Never Used  Substance Use Topics  . Alcohol use: No  . Drug use: No     Allergies   Citrus; Penicillins; and Vicodin [hydrocodone-acetaminophen]   Review of Systems Review of Systems  Constitutional: Negative for appetite change.  HENT: Positive for congestion.   Eyes: Positive for pain. Negative for photophobia and visual disturbance.    Respiratory: Positive for cough and shortness of breath.   Gastrointestinal: Negative for abdominal pain.  Genitourinary: Negative for flank pain.  Musculoskeletal: Negative for back pain.  Skin: Negative for rash.  Neurological: Negative for weakness.  Hematological: Negative for adenopathy.  Psychiatric/Behavioral: Negative for confusion.     Physical Exam Updated Vital Signs BP 128/79   Pulse (!) 59   Temp 98.4 F (36.9 C) (Oral)   Resp 16   SpO2 96%   Physical Exam HENT:     Head: Atraumatic.     Comments: Mild tenderness over right mastoid area. Eyes:     Extraocular Movements:     Right eye: Normal extraocular motion.     Left eye: Normal extraocular motion.  Neck:     Musculoskeletal: Neck supple.  Cardiovascular:     Rate and Rhythm: Normal rate and regular rhythm.  Abdominal:     Palpations: Abdomen is soft.     Tenderness: There is no abdominal tenderness.  Skin:    General: Skin is warm.  Neurological:     Mental Status: She is alert.     Cranial Nerves: No cranial nerve deficit.      ED Treatments / Results  Labs (all labs ordered are listed, but only abnormal results are displayed) Labs Reviewed - No data to display  EKG None  Radiology No results found.  Procedures Procedures (including critical care time)  Medications Ordered in ED Medications  ketorolac (TORADOL) 30 MG/ML injection 15 mg (15 mg Intravenous Given 12/22/18 0557)  sodium chloride 0.9 % bolus 500 mL (0 mLs Intravenous Stopped 12/22/18 0722)  promethazine (PHENERGAN) injection 12.5 mg (12.5 mg Intravenous Given 12/22/18 0557)     Initial Impression / Assessment and Plan / ED Course  I have reviewed the triage vital signs and the nursing notes.  Pertinent labs & imaging results that were available during my care of the patient were reviewed by me and considered in my medical decision making (see chart for details).     Patient with headache.  History of cluster  headaches.  No relief with oxygen.  However feels better after migraine cocktail.  Follow-up with PCP or her neurologist.  Discharge home.  Final Clinical Impressions(s) / ED Diagnoses   Final diagnoses:  Cluster headache, not intractable, unspecified chronicity pattern    ED Discharge Orders    None       Benjiman CorePickering, Abbagayle Zaragoza, MD 12/22/18 [phone]

## 2019-02-19 DIAGNOSIS — R05 Cough: Secondary | ICD-10-CM | POA: Diagnosis not present

## 2019-02-22 DIAGNOSIS — G44009 Cluster headache syndrome, unspecified, not intractable: Secondary | ICD-10-CM | POA: Diagnosis not present

## 2019-03-19 DIAGNOSIS — G44009 Cluster headache syndrome, unspecified, not intractable: Secondary | ICD-10-CM | POA: Diagnosis not present

## 2019-04-02 ENCOUNTER — Other Ambulatory Visit: Payer: Self-pay

## 2019-04-02 ENCOUNTER — Encounter: Payer: Self-pay | Admitting: Family Medicine

## 2019-04-02 ENCOUNTER — Ambulatory Visit (INDEPENDENT_AMBULATORY_CARE_PROVIDER_SITE_OTHER): Payer: BLUE CROSS/BLUE SHIELD | Admitting: Family Medicine

## 2019-04-02 VITALS — Resp 12

## 2019-04-02 DIAGNOSIS — J301 Allergic rhinitis due to pollen: Secondary | ICD-10-CM

## 2019-04-02 DIAGNOSIS — E669 Obesity, unspecified: Secondary | ICD-10-CM

## 2019-04-02 DIAGNOSIS — J45909 Unspecified asthma, uncomplicated: Secondary | ICD-10-CM | POA: Insufficient documentation

## 2019-04-02 DIAGNOSIS — E1169 Type 2 diabetes mellitus with other specified complication: Secondary | ICD-10-CM

## 2019-04-02 DIAGNOSIS — Z87898 Personal history of other specified conditions: Secondary | ICD-10-CM

## 2019-04-02 DIAGNOSIS — Z6838 Body mass index (BMI) 38.0-38.9, adult: Secondary | ICD-10-CM

## 2019-04-02 MED ORDER — ACCU-CHEK SOFT TOUCH LANCETS MISC: 12 refills | Status: DC

## 2019-04-02 MED ORDER — GLUCOSE BLOOD VI STRP: ORAL_STRIP | 1 refills | Status: DC

## 2019-04-02 MED ORDER — ACCU-CHEK AVIVA PLUS W/DEVICE KIT: 1.0000 | PACK | Freq: Every day | 0 refills | Status: DC

## 2019-04-02 MED ORDER — MONTELUKAST SODIUM 10 MG PO TABS: 10.0000 mg | ORAL_TABLET | Freq: Every day | ORAL | 3 refills | Status: DC

## 2019-04-02 MED ORDER — ALBUTEROL SULFATE HFA 108 (90 BASE) MCG/ACT IN AERS: 2.0000 | INHALATION_SPRAY | Freq: Four times a day (QID) | RESPIRATORY_TRACT | 0 refills | Status: DC | PRN

## 2019-04-02 NOTE — Progress Notes (Signed)
Virtual Visit via Video Note   I connected with'@on'  04/02/19 at  2:30 PM EDT by a video enabled telemedicine application and verified that I am speaking with the correct person using two identifiers.  Location patient: home Location provider:work or home office Persons participating in the virtual visit: patient, provider  I discussed the limitations of evaluation and management by telemedicine and the availability of in person appointments. The patient expressed understanding and agreed to proceed.   HPI: I am seeing [name] today for chronic disease management. Last seen on 05/04/17.  Lab Results  Component Value Date   HGBA1C 8.1 (H) 05/04/2017   She decided not to take medication. States that she increased water intake and trying to eat healthier. She is not checking BS at home. Last eye exam about a year ago, negative for retinopathy.  Denies abdominal pain, nausea,vomiting, polydipsia,polyuria, or polyphagia. Negative for unusual fatigue.   She has lost about 15 pounds since her last visit. She is not exercising regularly.  Concerned about asthma, having wheezing intermittently ,usually worse during this time of the year.   "Little" non productive cough. Denies associated fever,chills, fatigue,sore throat,chest pain,or rash.  Symptoms exacerbated by pollen. She uses albuterol inhaler as needed, which helps with symptoms.  Rhinorrhea and postnasal drainage, she does not feel like Claritin is helping. She denies fever, chills, unusual fatigue, sore throat, or body aches.   ROS: See pertinent positives and negatives per HPI.  Past Medical History:  Diagnosis Date  . Allergy   . Arthritis   . Diabetes mellitus without complication ([place])    4540  . Family history of anesthesia complication    "Mama did; she has alzheimer's; any anesthesia puts her in the twilight zone"  . Gallstones   . GERD (gastroesophageal reflux disease)   . Migraine    "cluster migraine"  .  Sciatic nerve injury    "right side"    Past Surgical History:  Procedure Laterality Date  . ANTERIOR CRUCIATE LIGAMENT REPAIR Right 1996  . CHOLECYSTECTOMY N/A 08/31/2014   Procedure: LAPAROSCOPIC CHOLECYSTECTOMY WITH INTRAOPERATIVE CHOLANGIOGRAM;  Surgeon: Greer Pickerel, MD;  Location: [place];  Service: General;  Laterality: N/A;  . KNEE ARTHROSCOPY Right 1998   with ACL reconstruction  . LAPAROSCOPIC CHOLECYSTECTOMY  08/31/2014  . TONSILLECTOMY  ~ 41    Family History  Problem Relation Age of Onset  . Dementia Mother   . Cancer Father        prostate  . Diabetes Daughter   . Hyperlipidemia Daughter   . Hypertension Daughter     Social History   Socioeconomic History  . Marital status: Divorced    Spouse name: Not on file  . Number of children: Not on file  . Years of education: Not on file  . Highest education level: Not on file  Occupational History  . Not on file  Social Needs  . Financial resource strain: Not on file  . Food insecurity:    Worry: Not on file    Inability: Not on file  . Transportation needs:    Medical: Not on file    Non-medical: Not on file  Tobacco Use  . Smoking status: Never Smoker  . Smokeless tobacco: Never Used  Substance and Sexual Activity  . Alcohol use: No  . Drug use: No  . Sexual activity: Yes  Lifestyle  . Physical activity:    Days per week: Not on file    Minutes per session: Not on  file  . Stress: Not on file  Relationships  . Social connections:    Talks on phone: Not on file    Gets together: Not on file    Attends religious service: Not on file    Active member of club or organization: Not on file    Attends meetings of clubs or organizations: Not on file    Relationship status: Not on file  . Intimate partner violence:    Fear of current or ex partner: Not on file    Emotionally abused: Not on file    Physically abused: Not on file    Forced sexual activity: Not on file  Other Topics Concern  . Not on file   Social History Narrative  . Not on file      Current Outpatient Medications:  .  acetaminophen (TYLENOL) 325 MG tablet, Take 2 tablets (650 mg total) by mouth every 4 (four) hours as needed for fever (Temp > 101.0). (Patient not taking: Reported on 12/20/2018), Disp: , Rfl:  .  albuterol (PROVENTIL HFA;VENTOLIN HFA) 108 (90 Base) MCG/ACT inhaler, Inhale 2 puffs into the lungs every 6 (six) hours as needed for wheezing or shortness of breath., Disp: 3 Inhaler, Rfl: 0 .  Blood Glucose Monitoring Suppl (ACCU-CHEK AVIVA PLUS) w/Device KIT, 1 Device by Does not apply route daily., Disp: 1 kit, Rfl: 0 .  glucose blood (ACCU-CHEK AVIVA PLUS) test strip, 2 times daily, Disp: 180 each, Rfl: 1 .  Lancets (ACCU-CHEK SOFT TOUCH) lancets, 2/day, Disp: 180 each, Rfl: 12 .  montelukast (SINGULAIR) 10 MG tablet, Take 1 tablet (10 mg total) by mouth at bedtime., Disp: 30 tablet, Rfl: 3  EXAM:  VITALS per patient if applicable:Resp 12   GENERAL: alert, oriented, appears well and in no acute distress  HEENT: atraumatic, conjunctiva clear, no obvious abnormalities on inspection of face.  NECK: normal movements of the head and neck  LUNGS: on inspection no signs of respiratory distress, breathing rate appears normal, no obvious gross SOB, gasping or wheezing  CV: no obvious cyanosis  MS: moves all visible extremities without noticeable abnormality  PSYCH/NEURO: pleasant and cooperative, no obvious depression or anxiety, speech and thought processing grossly intact  ASSESSMENT AND PLAN:  Discussed the following assessment and plan:  Diabetes mellitus type 2 in obese ([place]) HgA1C has not been at goal. She is not interested in pharmacologic treatment and does not want blood work scheduled at this time. We discussed possible complications of poorly controlled DM. Regular exercise and healthy diet with avoidance of added sugar food intake is an important part of treatment and recommended. She is due  for eye exam.   Instructed to monitor BS periodically. Periodic dental and foot care also recommended. F/U in 4 months   Allergic rhinitis Symptomatic. Singulair 10 mg daily added today. Also recommend considering changing Claritin for a different OTC antihistaminic.   Mild reactive airways disease Continue albuterol inhaler 2 puff every 4-6 hours as needed for wheezing. Singulair 10 mg added today. Instructed about warning signs. Follow-up in 4 months.  Class 2 obesity with body mass index (BMI) of 38.0 to 38.9 in adult We discussed benefits of wt loss as well as adverse effects of obesity. Consistency with healthy diet and physical activity recommended.    I discussed the assessment and treatment plan with the patient. The patient was provided an opportunity to ask questions and all were answered. The patient agreed with the plan and demonstrated an understanding of the  instructions.    Return in about 4 months (around 08/02/2019) for fasting labs,DM II,CPE.    Benford Asch [place], MD

## 2019-04-02 NOTE — Assessment & Plan Note (Signed)
Continue albuterol inhaler 2 puff every 4-6 hours as needed for wheezing. Singulair 10 mg added today. Instructed about warning signs. Follow-up in 4 months.

## 2019-04-02 NOTE — Assessment & Plan Note (Signed)
We discussed benefits of wt loss as well as adverse effects of obesity. Consistency with healthy diet and physical activity recommended.  

## 2019-04-02 NOTE — Assessment & Plan Note (Signed)
HgA1C has not been at goal. She is not interested in pharmacologic treatment and does not want blood work scheduled at this time. We discussed possible complications of poorly controlled DM. Regular exercise and healthy diet with avoidance of added sugar food intake is an important part of treatment and recommended. She is due for eye exam.   Instructed to monitor BS periodically. Periodic dental and foot care also recommended. F/U in 4 months

## 2019-04-02 NOTE — Assessment & Plan Note (Signed)
Symptomatic. Singulair 10 mg daily added today. Also recommend considering changing Claritin for a different OTC antihistaminic.

## 2019-06-05 ENCOUNTER — Other Ambulatory Visit: Payer: Self-pay | Admitting: Family Medicine

## 2019-06-05 DIAGNOSIS — J45909 Unspecified asthma, uncomplicated: Secondary | ICD-10-CM

## 2019-06-10 ENCOUNTER — Telehealth: Payer: Self-pay | Admitting: Family Medicine

## 2019-06-10 NOTE — Telephone Encounter (Signed)
Copied from [place] [phone]. Topic: Quick Communication - Rx Refill/Question >> Jun 10, 2019 11:00 AM Gustavus Messing wrote: Medication: albuterol (PROVENTIL HFA;VENTOLIN HFA) 108 (90 Base) MCG/ACT inhaler   Has the patient contacted their pharmacy? Yes.   (Agent: If yes, when and what did the pharmacy advise?) The patient needs her inhaler and has been in contact with the pharmacy. The pharmacy has been requesting it but has not heard back from the office.  Preferred Pharmacy (with phone number or street name): [id number] - [place], [place] [place] [phone] (Phone) [phone] (Fax)   Agent: Please be advised that RX refills may take up to 3 business days. We ask that you follow-up with your pharmacy.

## 2019-06-11 NOTE — Telephone Encounter (Signed)
Rx was sent in today by Dr. Martinique. Nothing further needed.

## 2019-06-11 NOTE — Telephone Encounter (Signed)
Pt is requesting out of work note, stating that because of her conditions she needs to be able to work from home. Her HR requires that she produce a note from her provider.

## 2019-06-13 ENCOUNTER — Encounter: Payer: Self-pay | Admitting: Family Medicine

## 2019-06-14 NOTE — Telephone Encounter (Signed)
Please advise 

## 2019-06-14 NOTE — Telephone Encounter (Signed)
Patient checking on the status of letter, and would like letter sent to [place].  (please reference patient My Chart Message regarding specifics)

## 2019-06-17 NOTE — Telephone Encounter (Signed)
It is okay to do so if she can perform her job functions from home. Thanks, BJ

## 2019-06-19 ENCOUNTER — Encounter: Payer: Self-pay | Admitting: Allergy

## 2019-06-19 ENCOUNTER — Encounter: Payer: Self-pay | Admitting: *Deleted

## 2019-06-19 ENCOUNTER — Other Ambulatory Visit: Payer: Self-pay

## 2019-06-19 ENCOUNTER — Ambulatory Visit: Payer: BC Managed Care – PPO | Admitting: Allergy

## 2019-06-19 VITALS — BP 130/70 | HR 86 | Temp 98.0°F | Resp 16 | Ht 59.0 in | Wt 186.0 lb

## 2019-06-19 DIAGNOSIS — R062 Wheezing: Secondary | ICD-10-CM | POA: Diagnosis not present

## 2019-06-19 DIAGNOSIS — T50905D Adverse effect of unspecified drugs, medicaments and biological substances, subsequent encounter: Secondary | ICD-10-CM

## 2019-06-19 DIAGNOSIS — R0602 Shortness of breath: Secondary | ICD-10-CM | POA: Insufficient documentation

## 2019-06-19 DIAGNOSIS — J3089 Other allergic rhinitis: Secondary | ICD-10-CM | POA: Diagnosis not present

## 2019-06-19 NOTE — Telephone Encounter (Signed)
Letter completed and sent via MyChart

## 2019-06-19 NOTE — Patient Instructions (Addendum)
Today's skin testing showed:  Positive to grass pollen only.  Start environmental control measures.  May use over the counter antihistamines such as Zyrtec (cetirizine), Claritin (loratadine), Allegra (fexofenadine), or Xyzal (levocetirizine) daily as needed. Take antihistamine without the decongestant in it.  Continue singulair 10mg  tablet daily.  Cautioned that in some children/adults can experience behavioral changes including hyperactivity, agitation, depression, sleep disturbances and suicidal ideations. These side effects are rare, but if you notice them you should notify me and discontinue Singulair (montelukast).  Continue Flonase 1 spray twice a day.  Nasal saline spray (i.e., Simply Saline) or nasal saline lavage (i.e., NeilMed) is recommended as needed and prior to medicated nasal sprays.  Breathing:  Your breathing testing today was normal and did not improve after bronchodilator treatment.  Start symbicort 80 2 puffs twice a day with spacer and rinse mouth afterwards for 1 month and monitor symptoms.   May use albuterol rescue inhaler 2 puffs or nebulizer every 4 to 6 hours as needed for shortness of breath, chest tightness, coughing, and wheezing. May use albuterol rescue inhaler 2 puffs 5 to 15 minutes prior to strenuous physical activities. Monitor frequency of use.   Get bloodwork   Follow up in 1 month  Reducing Pollen Exposure . Pollen seasons: trees (spring), grass (summer) and ragweed/weeds (fall). Marland Kitchen Keep windows closed in your home and car to lower pollen exposure.  Susa Simmonds air conditioning in the bedroom and throughout the house if possible.  . Avoid going out in dry windy days - especially early morning. . Pollen counts are highest between 5 - 10 AM and on dry, hot and windy days.  . Save outside activities for late afternoon or after a heavy rain, when pollen levels are lower.  . Avoid mowing of grass if you have grass pollen allergy. Marland Kitchen Be aware that  pollen can also be transported indoors on people and pets.  . Dry your clothes in an automatic dryer rather than hanging them outside where they might collect pollen.  . Rinse hair and eyes before bedtime.

## 2019-06-19 NOTE — Telephone Encounter (Signed)
Letter completed by Roselyn Reef, RN. Patient aware that she can print from my chart.  Copied from [place] [phone]. Topic: General - Other >> Jun 10, 2019 11:13 AM Gustavus Messing wrote: Reason for CRM: The patient needs a letter to her place of employment stating that she is high risk for Covid-19 because of her asthma. Her job is trying to make her come back in when she can just work from home like she has been doing. >> Jun 10, 2019 11:31 AM Cox, Melburn Hake, CMA wrote: Please advise if pt needs virtual visit to discuss.

## 2019-06-19 NOTE — Progress Notes (Signed)
New Patient Note  RE: [name] MRN: [id number] DOB: 1966/07/14 Date of Office Visit: 06/19/2019  Referring provider: No ref. provider found Primary care provider: Martinique, Betty G, MD  Chief Complaint: Establish Care (not diagnosed with asthma but feels she should be.)  History of Present Illness: I had the pleasure of seeing [name] for initial evaluation at the [facility] on 06/20/2019. She is a 53 y.o. female, who is self-referred here for the evaluation of asthma and allergies.  Respiratory:  She reports symptoms of chest tightness, shortness of breath, coughing, wheezing for 20 years with worsening symptoms lately. Current medications include albuterol prn which help. She reports not using aerochamber with asthma inhalers. She tried the following inhalers: albuterol. Main asthma triggers are weather changes and smoke exposure. In the last month, frequency of asthma symptoms: daily. Frequency of nocturnal symptoms: 0x/month. Frequency of SABA use: daily. Interference with physical activity: yes. Sleep is disturbed. In the last 12 months, emergency room visits/urgent care visits/doctor office visits or hospitalizations due to asthma: no. In the last 12 months, oral steroids courses: no. Lifetime history of hospitalization for asthma: no. Prior intubations: no. History of pneumonia: no. She was evaluated by allergist in the past. Smoking exposure: mother used to smoke. Up to date with flu vaccine: no. History of reflux: no.  Rhinitis:  She reports symptoms of sneezing, rhinorrhea, headaches, itchy/watery eyes. Symptoms have been going on for 20 years. The symptoms are present mainly from spring through the fall. Other triggers include exposure to pollen. Anosmia: no. Headache: yes. She has used Claritin-D, Singulair, Flonase 1 spray BID with some improvement in symptoms. Sinus infections: no. Previous work up includes: not sure if she had skin testing as a child.  Previous ENT evaluation: sinus surgery in 1998. Previous sinus imaging: not recently. History of nasal polyps: no.  Assessment and Plan: [name] is a 53 y.o. female with: Shortness of breath Patient concerned about asthma as she noticed chest tightness, SOB, coughing and wheezing for 20 years with worsening symptoms lately. Using albuterol multiple times a day with some benefit. Breathing:  Today's spirometry was normal and did not improve after bronchodilator treatment but clinically felt slightly improved. Discussed with patient that her respiratory symptoms may be multifactorial in nature.   Given daily symptoms will start Symbicort 80 2 puffs twice a day with spacer and rinse mouth afterwards for 1 month and monitor symptoms. Spacer and samples of Symbicort given.   May use albuterol rescue inhaler 2 puffs or nebulizer every 4 to 6 hours as needed for shortness of breath, chest tightness, coughing, and wheezing. May use albuterol rescue inhaler 2 puffs 5 to 15 minutes prior to strenuous physical activities. Monitor frequency of use.   Get bloodwork as below to rule out other etiologies.  If negative bloodwork and no improvement with Symbicort then consider cardiac evaluation and/or methacholine challenge next.   Other allergic rhinitis Rhino conjunctivitis symptoms for the past 20 years during the spring through fall. Tried Claritin-D, Singulair and Flonase with some benefit. Today's skin testing showed: positive to grass pollen only.  Start environmental control measures.  May use over the counter antihistamines such as Zyrtec (cetirizine), Claritin (loratadine), Allegra (fexofenadine), or Xyzal (levocetirizine) daily as needed. Take antihistamine without the decongestant in it.  Continue Singulair 60m tablet daily.  Cautioned that in some children/adults can experience behavioral changes including hyperactivity, agitation, depression, sleep disturbances and suicidal ideations.  These side effects are  rare, but if you notice them you should notify me and discontinue Singulair (montelukast).  Continue Flonase 1 spray twice a day.  Nasal saline spray (i.e., Simply Saline) or nasal saline lavage (i.e., NeilMed) is recommended as needed and prior to medicated nasal sprays.  Drug reaction Hives with penicillin and Vicodin.   Continue to avoid for now. Consider penicillin testing in future.   Return in about 4 weeks (around 07/17/2019).  Lab Orders     CBC with Differential/Platelet     Comprehensive metabolic panel     Thyroid Cascade Profile  Other allergy screening: Food allergy:  citrus - hives  Medication allergy: yes  Penicillin - hives Vicodin - hives Hymenoptera allergy: no Urticaria: no Eczema:no History of recurrent infections suggestive of immunodeficency: no  Diagnostics: Spirometry:  Tracings reviewed. Her effort: It was hard to get consistent efforts and there is a question as to whether this reflects a maximal maneuver. FVC: 2.02L FEV1: 1.72L, 83% predicted FEV1/FVC ratio: 85% Interpretation: Spirometry consistent with possible restrictive disease and no improvement in FEV1 post bronchodilator treatment.   Please see scanned spirometry results for details.  Skin Testing: Environmental allergy panel and select foods. Positive test to: grass. Results discussed with patient/family. Airborne Adult Perc - 06/19/19 1448    Time Antigen Placed  1448    Allergen Manufacturer  Lavella Hammock    Location  Back    Number of Test  59    1. Control-Buffer 50% Glycerol  Negative    2. Control-Histamine 1 mg/ml  2+    3. Albumin saline  Negative    4. Marion  Negative    5. Guatemala  Negative    6. Johnson  Negative    7. Homer Blue  Negative    8. Meadow Fescue  Negative    9. Perennial Rye  Negative    10. Sweet Vernal  Negative    11. Timothy  Negative    12. Cocklebur  Negative    13. Burweed Marshelder  Negative    14. Ragweed, short   Negative    15. Ragweed, Giant  Negative    16. Plantain,  English  Negative    17. Lamb's Quarters  Negative    18. Sheep Sorrell  Negative    19. Rough Pigweed  Negative    20. Marsh Elder, Rough  Negative    21. Mugwort, Common  Negative    22. Ash mix  Negative    23. Birch mix  Negative    24. Beech American  Negative    25. Box, Elder  Negative    26. Cedar, red  Negative    27. Cottonwood, [place]  Negative    28. Elm mix  Negative    29. Hickory mix  Negative    30. Maple mix  Negative    31. Oak, [place] mix  Negative    32. Pecan Pollen  Negative    33. Pine mix  Negative    34. Sycamore Eastern  Negative    35. [place], Black Pollen  Negative    36. Alternaria alternata  Negative    37. Cladosporium Herbarum  Negative    38. Aspergillus mix  Negative    39. Penicillium mix  Negative    40. Bipolaris sorokiniana (Helminthosporium)  Negative    41. Drechslera spicifera (Curvularia)  Negative    42. Mucor plumbeus  Negative    43. Fusarium moniliforme  Negative    44. Aureobasidium pullulans (  pullulara)  Negative    45. Rhizopus oryzae  Negative    46. Botrytis cinera  Negative    47. Epicoccum nigrum  Negative    48. Phoma betae  Negative    49. Candida Albicans  Negative    50. Trichophyton mentagrophytes  Negative    51. Mite, D Farinae  5,000 AU/ml  Negative    52. Mite, D Pteronyssinus  5,000 AU/ml  Negative    53. Cat Hair 10,000 BAU/ml  Negative    54.  Dog Epithelia  Negative    55. Mixed Feathers  Negative    56. Horse Epithelia  Negative    57. Cockroach, German  Negative    58. Mouse  Negative    59. Tobacco Leaf  Negative     Intradermal - 06/19/19 1526    Time Antigen Placed  1526    Allergen Manufacturer  Lavella Hammock    Location  Arm    Number of Test  15    Control  Negative    Guatemala  Negative    Johnson  2+    7 Grass  4+    Ragweed mix  Negative    Weed mix  Negative    Tree mix  Negative    Mold 1  Negative    Mold 2  Negative    Mold  3  Negative    Mold 4  Negative    Cat  Negative    Dog  Negative    Cockroach  Negative    Mite mix  Negative     Food Adult Perc - 06/19/19 1400    Time Antigen Placed  1450    Allergen Manufacturer  Greer    Location  Back    Number of allergen test  10    1. Peanut  Negative    2. Soybean  Negative    3. Wheat  Negative    4. Sesame  Negative    5. Milk, cow  Negative    6. Egg White, Chicken  Negative    7. Casein  Negative    8. Shellfish Mix  Negative    9. Fish Mix  Negative    10. Cashew  Negative       Past Medical History: Patient Active Problem List   Diagnosis Date Noted  . Drug reaction 06/20/2019  . Shortness of breath 06/19/2019  . Mild reactive airways disease 04/02/2019  . Other allergic rhinitis 05/04/2017  . Diabetes mellitus type 2 in obese ([place]) 05/04/2017  . Class 2 obesity with body mass index (BMI) of 38.0 to 38.9 in adult 05/04/2017  . Calculus of gallbladder with acute cholecystitis 08/31/2014   Past Medical History:  Diagnosis Date  . Allergy   . Arthritis   . Diabetes mellitus without complication ([place])    7673  . Family history of anesthesia complication    "Mama did; she has alzheimer's; any anesthesia puts her in the twilight zone"  . Gallstones   . GERD (gastroesophageal reflux disease)   . Migraine    "cluster migraine"  . Sciatic nerve injury    "right side"   Past Surgical History: Past Surgical History:  Procedure Laterality Date  . ANTERIOR CRUCIATE LIGAMENT REPAIR Right 1996  . CHOLECYSTECTOMY N/A 08/31/2014   Procedure: LAPAROSCOPIC CHOLECYSTECTOMY WITH INTRAOPERATIVE CHOLANGIOGRAM;  Surgeon: Greer Pickerel, MD;  Location: [facility];  Service: General;  Laterality: N/A;  . KNEE ARTHROSCOPY Right 1998   with ACL  reconstruction  . LAPAROSCOPIC CHOLECYSTECTOMY  08/31/2014  . SINOSCOPY  1998  . TONSILLECTOMY  ~ 1978   Medication List:  Current Outpatient Medications  Medication Sig Dispense Refill  . acetaminophen (TYLENOL)  325 MG tablet Take 2 tablets (650 mg total) by mouth every 4 (four) hours as needed for fever (Temp > 101.0).    . Blood Glucose Monitoring Suppl (ACCU-CHEK AVIVA PLUS) w/Device KIT 1 Device by Does not apply route daily. 1 kit 0  . fluticasone (FLONASE) 50 MCG/ACT nasal spray Place 2 sprays into both nostrils 2 (two) times a day.    Marland Kitchen glucose blood (ACCU-CHEK AVIVA PLUS) test strip 2 times daily 180 each 1  . Lancets (ACCU-CHEK SOFT TOUCH) lancets 2/day 180 each 12  . loratadine-pseudoephedrine (CLARITIN-D 24-HOUR) 10-240 MG 24 hr tablet Take 1 tablet by mouth daily.    . montelukast (SINGULAIR) 10 MG tablet Take 1 tablet (10 mg total) by mouth at bedtime. 30 tablet 3  . PROAIR HFA 108 (90 Base) MCG/ACT inhaler INHALE 2 PUFFS INTO THE LUNGS EVERY 6 HOURS AS NEEDED FOR WHEEZING OR SHORTNESS OF BREATH 18 g 2  . SUMAtriptan (IMITREX) 50 MG tablet Take 50 mg by mouth every 2 (two) hours as needed for migraine. May repeat in 2 hours if headache persists or recurs.     No current facility-administered medications for this visit.    Allergies: Allergies  Allergen Reactions  . Citrus Hives  . Penicillins Hives  . Vicodin [Hydrocodone-Acetaminophen] Hives   Social History: Social History   Socioeconomic History  . Marital status: Divorced    Spouse name: Not on file  . Number of children: Not on file  . Years of education: Not on file  . Highest education level: Not on file  Occupational History  . Not on file  Social Needs  . Financial resource strain: Not on file  . Food insecurity    Worry: Not on file    Inability: Not on file  . Transportation needs    Medical: Not on file    Non-medical: Not on file  Tobacco Use  . Smoking status: Never Smoker  . Smokeless tobacco: Never Used  Substance and Sexual Activity  . Alcohol use: No  . Drug use: No  . Sexual activity: Yes  Lifestyle  . Physical activity    Days per week: Not on file    Minutes per session: Not on file  .  Stress: Not on file  Relationships  . Social Herbalist on phone: Not on file    Gets together: Not on file    Attends religious service: Not on file    Active member of club or organization: Not on file    Attends meetings of clubs or organizations: Not on file    Relationship status: Not on file  Other Topics Concern  . Not on file  Social History Narrative  . Not on file   Lives in a 54 year old home. Smoking: denies Occupation: Programmer, applications HistoryFreight forwarder in the house: no Charity fundraiser in the family room: yes Carpet in the bedroom: yes Heating: electric Cooling: central Pet: yes 1 dog x 1 yr  Family History: Family History  Problem Relation Age of Onset  . Dementia Mother   . Cancer Father        prostate  . Diabetes Daughter   . Hyperlipidemia Daughter   . Hypertension Daughter    Problem  Relation Asthma                                   No  Eczema                                Son  Food allergy                          No  Allergic rhino conjunctivitis     Father   Review of Systems  Constitutional: Negative for appetite change, chills, fever and unexpected weight change.  HENT: Positive for congestion and rhinorrhea.   Eyes: Positive for itching.  Respiratory: Positive for chest tightness and shortness of breath. Negative for cough and wheezing.   Cardiovascular: Negative for chest pain.  Gastrointestinal: Negative for abdominal pain.  Genitourinary: Negative for difficulty urinating.  Skin: Negative for rash.  Allergic/Immunologic: Negative for environmental allergies and food allergies.  Neurological: Positive for headaches.   Objective: BP 130/70 (BP Location: Right Arm, Patient Position: Sitting)   Pulse 86   Temp 98 F (36.7 C)   Resp 16   Ht _0  (1.499 m)   Wt 186 lb (84.4 kg)   SpO2 98%   BMI 37.57 kg/m  Body mass index is 37.57 kg/m. Physical Exam  Constitutional: She is  oriented to person, place, and time. She appears well-developed and well-nourished.  HENT:  Head: Normocephalic and atraumatic.  Right Ear: External ear normal.  Left Ear: External ear normal.  Nose: Nose normal.  Mouth/Throat: Oropharynx is clear and moist.  Eyes: Conjunctivae and EOM are normal.  Neck: Neck supple.  Cardiovascular: Normal rate, regular rhythm and normal heart sounds. Exam reveals no gallop and no friction rub.  No murmur heard. Pulmonary/Chest: Effort normal and breath sounds normal. She has no wheezes. She has no rales.  Abdominal: Soft.  Lymphadenopathy:    She has no cervical adenopathy.  Neurological: She is alert and oriented to person, place, and time.  Skin: Skin is warm. No rash noted.  Psychiatric: She has a normal mood and affect. Her behavior is normal.  Nursing note and vitals reviewed.  The plan was reviewed with the patient/family, and all questions/concerned were addressed.  It was my pleasure to see [name] today and participate in her care. Please feel free to contact me with any questions or concerns.  Sincerely,  Rexene Alberts, DO Allergy & Immunology  [facility] office: [phone] [facility] office: [phone]  65 minutes spent face-to-face with more than 50% of the time spent discussing allergies and shortness of breath.

## 2019-06-20 DIAGNOSIS — T50905A Adverse effect of unspecified drugs, medicaments and biological substances, initial encounter: Secondary | ICD-10-CM | POA: Insufficient documentation

## 2019-06-20 LAB — COMPREHENSIVE METABOLIC PANEL
ALT: 69 IU/L — ABNORMAL HIGH (ref 0–32)
AST: 42 IU/L — ABNORMAL HIGH (ref 0–40)
Albumin/Globulin Ratio: 2 (ref 1.2–2.2)
Albumin: 4.9 g/dL (ref 3.8–4.9)
Alkaline Phosphatase: 109 IU/L (ref 39–117)
BUN/Creatinine Ratio: 11 (ref 9–23)
BUN: 10 mg/dL (ref 6–24)
Bilirubin Total: 0.3 mg/dL (ref 0.0–1.2)
CO2: 27 mmol/L (ref 20–29)
Calcium: 10.1 mg/dL (ref 8.7–10.2)
Chloride: 101 mmol/L (ref 96–106)
Creatinine, Ser: 0.89 mg/dL (ref 0.57–1.00)
GFR calc Af Amer: 85 mL/min/{1.73_m2} (ref >59)
GFR calc non Af Amer: 74 mL/min/{1.73_m2} (ref >59)
Globulin, Total: 2.5 g/dL (ref 1.5–4.5)
Glucose: 153 mg/dL — ABNORMAL HIGH (ref 65–99)
Potassium: 4.2 mmol/L (ref 3.5–5.2)
Sodium: 141 mmol/L (ref 134–144)
Total Protein: 7.4 g/dL (ref 6.0–8.5)

## 2019-06-20 LAB — CBC WITH DIFFERENTIAL/PLATELET
Basophils Absolute: 0.1 10*3/uL (ref 0.0–0.2)
Basos: 1 %
EOS (ABSOLUTE): 0.4 10*3/uL (ref 0.0–0.4)
Eos: 5 %
Hematocrit: 43.4 % (ref 34.0–46.6)
Hemoglobin: 14.5 g/dL (ref 11.1–15.9)
Immature Grans (Abs): 0 10*3/uL (ref 0.0–0.1)
Immature Granulocytes: 0 %
Lymphocytes Absolute: 3.4 10*3/uL — ABNORMAL HIGH (ref 0.7–3.1)
Lymphs: 42 %
MCH: 29.6 pg (ref 26.6–33.0)
MCHC: 33.4 g/dL (ref 31.5–35.7)
MCV: 89 fL (ref 79–97)
Monocytes Absolute: 0.6 10*3/uL (ref 0.1–0.9)
Monocytes: 7 %
Neutrophils Absolute: 3.7 10*3/uL (ref 1.4–7.0)
Neutrophils: 45 %
Platelets: 260 10*3/uL (ref 150–450)
RBC: 4.9 x10E6/uL (ref 3.77–5.28)
RDW: 13.3 % (ref 11.7–15.4)
WBC: 8.2 10*3/uL (ref 3.4–10.8)

## 2019-06-20 LAB — THYROID CASCADE PROFILE: TSH: 3.29 u[IU]/mL (ref 0.450–4.500)

## 2019-06-20 NOTE — Assessment & Plan Note (Signed)
Rhino conjunctivitis symptoms for the past 20 years during the spring through fall. Tried Claritin-D, Singulair and Flonase with some benefit. Today's skin testing showed: positive to grass pollen only.  Start environmental control measures.  May use over the counter antihistamines such as Zyrtec (cetirizine), Claritin (loratadine), Allegra (fexofenadine), or Xyzal (levocetirizine) daily as needed. Take antihistamine without the decongestant in it.  Continue Singulair 10mg  tablet daily.  Cautioned that in some children/adults can experience behavioral changes including hyperactivity, agitation, depression, sleep disturbances and suicidal ideations. These side effects are rare, but if you notice them you should notify me and discontinue Singulair (montelukast).  Continue Flonase 1 spray twice a day.  Nasal saline spray (i.e., Simply Saline) or nasal saline lavage (i.e., NeilMed) is recommended as needed and prior to medicated nasal sprays.

## 2019-06-20 NOTE — Assessment & Plan Note (Addendum)
Patient concerned about asthma as she noticed chest tightness, SOB, coughing and wheezing for 20 years with worsening symptoms lately. Using albuterol multiple times a day with some benefit. Breathing:  Today's spirometry was normal and did not improve after bronchodilator treatment but clinically felt slightly improved. Discussed with patient that her respiratory symptoms may be multifactorial in nature.   Given daily symptoms will start Symbicort 80 2 puffs twice a day with spacer and rinse mouth afterwards for 1 month and monitor symptoms. Spacer and samples of Symbicort given.   May use albuterol rescue inhaler 2 puffs or nebulizer every 4 to 6 hours as needed for shortness of breath, chest tightness, coughing, and wheezing. May use albuterol rescue inhaler 2 puffs 5 to 15 minutes prior to strenuous physical activities. Monitor frequency of use.   Get bloodwork as below to rule out other etiologies.  If negative bloodwork and no improvement with Symbicort then consider cardiac evaluation and/or methacholine challenge next.

## 2019-06-20 NOTE — Assessment & Plan Note (Signed)
Hives with penicillin and Vicodin.   Continue to avoid for now. Consider penicillin testing in future.

## 2019-06-21 ENCOUNTER — Encounter: Payer: Self-pay | Admitting: Allergy

## 2019-07-22 ENCOUNTER — Encounter: Payer: Self-pay | Admitting: Allergy

## 2019-07-22 ENCOUNTER — Other Ambulatory Visit: Payer: Self-pay

## 2019-07-22 ENCOUNTER — Ambulatory Visit: Payer: BC Managed Care – PPO | Admitting: Allergy

## 2019-07-22 VITALS — BP 126/78 | HR 78 | Temp 97.6°F | Resp 18

## 2019-07-22 DIAGNOSIS — J301 Allergic rhinitis due to pollen: Secondary | ICD-10-CM

## 2019-07-22 DIAGNOSIS — R0602 Shortness of breath: Secondary | ICD-10-CM | POA: Diagnosis not present

## 2019-07-22 DIAGNOSIS — T50905D Adverse effect of unspecified drugs, medicaments and biological substances, subsequent encounter: Secondary | ICD-10-CM | POA: Diagnosis not present

## 2019-07-22 NOTE — Assessment & Plan Note (Signed)
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 Tried Claritin-D, Singulair and Flonase with some benefit. 2020 skin testing showed: positive to grass pollen only. Interim history - stable.   Continue environmental control measures.  May use over the counter antihistamines such as Zyrtec (cetirizine), Claritin (loratadine), Allegra (fexofenadine), or Xyzal (levocetirizine) daily as needed. Take antihistamine without the decongestant in it.  Continue Singulair 10mg  tablet daily.  Continue Flonase 1 spray twice a day.  Nasal saline spray (i.e., Simply Saline) or nasal saline lavage (i.e., NeilMed) is recommended as needed and prior to medicated nasal sprays.

## 2019-07-22 NOTE — Progress Notes (Signed)
Follow Up Note  RE: [name] MRN: [id number] DOB: 06-06-1966 Date of Office Visit: 07/22/2019  Referring provider: Martinique, Betty G, MD Primary care provider: Martinique, Betty G, MD  Chief Complaint: Shortness of Breath (patient says her shortness of breath is about the same. she did have an episode yesterday where she felt as if her throat was closing up. )  History of Present Illness: I had the pleasure of seeing [name] for a follow up visit at the [facility] on 07/22/2019. She is a 53 y.o. female, who is being followed for SOB, allergic rhino conjunctivitis and drug allergy. Today she is here for regular follow up visit. Her previous allergy office visit was on 06/19/2019 with Dr. Maudie Mercury.   Shortness of breath Breathing is about the same as before and did not notice any significant change with Symbicort.  Patient gets SOB and wheezing in the mornings. She also seeps with the ceiling fan on due to menopausal hot flashes. No recent cardiac work up. Denies any heartburn/reflux   Currently on Symbicort 80 2 puffs BID and albuterol prn. The albuterol seems to help more.   Other allergic rhinitis Currently on allegra daily at night and Singulair.  Using Flonase 1 spray twice a day.   Assessment and Plan: [name] is a 53 y.o. female with: Shortness of breath Past history - Patient concerned about asthma as she noticed chest tightness, SOB, coughing and wheezing for 20 years with worsening symptoms lately. Using albuterol multiple times a day with some benefit. 2020 spirometry was normal and did not improve after bronchodilator treatment but clinically felt slightly improved.  Interim history - No significant improvement with Symbicort however feels better after using albuterol. CBC normal. . Get EKG to rule out any cardiac component of her SOB. . Get methacholine challenge breathing test which is the gold diagnostic standard for asthma diagnosis.  . Do not sleep  overnight with ceiling fan on. . Daily controller medication(s): continue Symbicort 80 2 puffs twice a day with spacer and rinse mouth afterwards. . Prior to physical activity: May use albuterol rescue inhaler 2 puffs 5 to 15 minutes prior to strenuous physical activities. Marland Kitchen Rescue medications: May use albuterol rescue inhaler 2 puffs or nebulizer every 4 to 6 hours as needed for shortness of breath, chest tightness, coughing, and wheezing. Monitor frequency of use.  . If EKG and methacholine challenge are normal then get CXR, consider starting PPI for possible reflux/heartburn and/or stop Symbicort.   Seasonal allergic rhinitis due to pollen Past history - Rhino conjunctivitis symptoms for the past 20 years during the spring through fall. Tried Claritin-D, Singulair and Flonase with some benefit. 2020 skin testing showed: positive to grass pollen only. Interim history - stable.   Continue environmental control measures.  May use over the counter antihistamines such as Zyrtec (cetirizine), Claritin (loratadine), Allegra (fexofenadine), or Xyzal (levocetirizine) daily as needed. Take antihistamine without the decongestant in it.  Continue Singulair 80m tablet daily.  Continue Flonase 1 spray twice a day.  Nasal saline spray (i.e., Simply Saline) or nasal saline lavage (i.e., NeilMed) is recommended as needed and prior to medicated nasal sprays.  Drug reaction Past history - Hives with penicillin and Vicodin.   Continue to avoid for now. Consider penicillin testing in future.   Return in about 6 weeks (around 09/02/2019).  Diagnostics: Spirometry:  Tracings reviewed. Her effort: Good reproducible efforts. FVC: 1.97L FEV1: 1.70L, 86% predicted FEV1/FVC ratio: 86% Interpretation:  Spirometry consistent with possible restrictive disease.  Please see scanned spirometry results for details.  Medication List:  Current Outpatient Medications  Medication Sig Dispense Refill  . Blood  Glucose Monitoring Suppl (ACCU-CHEK AVIVA PLUS) w/Device KIT 1 Device by Does not apply route daily. 1 kit 0  . budesonide-formoterol (SYMBICORT) 80-4.5 MCG/ACT inhaler Inhale 2 puffs into the lungs 2 (two) times daily.    . fexofenadine (ALLEGRA) 180 MG tablet Take 180 mg by mouth daily.    . fluticasone (FLONASE) 50 MCG/ACT nasal spray Place 2 sprays into both nostrils 2 (two) times a day.    Marland Kitchen glucose blood (ACCU-CHEK AVIVA PLUS) test strip 2 times daily 180 each 1  . Lancets (ACCU-CHEK SOFT TOUCH) lancets 2/day 180 each 12  . montelukast (SINGULAIR) 10 MG tablet Take 1 tablet (10 mg total) by mouth at bedtime. 30 tablet 3  . PROAIR HFA 108 (90 Base) MCG/ACT inhaler INHALE 2 PUFFS INTO THE LUNGS EVERY 6 HOURS AS NEEDED FOR WHEEZING OR SHORTNESS OF BREATH 18 g 2  . SUMAtriptan (IMITREX) 50 MG tablet Take 50 mg by mouth every 2 (two) hours as needed for migraine. May repeat in 2 hours if headache persists or recurs.     No current facility-administered medications for this visit.    Allergies: Allergies  Allergen Reactions  . Citrus Hives  . Penicillins Hives  . Vicodin [Hydrocodone-Acetaminophen] Hives   I reviewed her past medical history, social history, family history, and environmental history and no significant changes have been reported from previous visit on 06/19/2019.  Review of Systems  Constitutional: Negative for appetite change, chills, fever and unexpected weight change.  HENT: Positive for postnasal drip. Negative for congestion and rhinorrhea.   Eyes: Negative for itching.  Respiratory: Positive for chest tightness and shortness of breath. Negative for cough and wheezing.   Cardiovascular: Negative for chest pain.  Gastrointestinal: Negative for abdominal pain.  Genitourinary: Negative for difficulty urinating.  Skin: Negative for rash.  Allergic/Immunologic: Negative for environmental allergies and food allergies.  Neurological: Positive for headaches.   Objective:  BP 126/78 (BP Location: Left Arm, Patient Position: Sitting, Cuff Size: Normal)   Pulse 78   Temp 97.6 F (36.4 C) (Temporal)   Resp 18   SpO2 97%  There is no height or weight on file to calculate BMI. Physical Exam  Constitutional: She is oriented to person, place, and time. She appears well-developed and well-nourished.  HENT:  Head: Normocephalic and atraumatic.  Right Ear: External ear normal.  Left Ear: External ear normal.  Nose: Nose normal.  Mouth/Throat: Oropharynx is clear and moist.  Eyes: Conjunctivae and EOM are normal.  Neck: Neck supple.  Cardiovascular: Normal rate, regular rhythm and normal heart sounds. Exam reveals no gallop and no friction rub.  No murmur heard. Pulmonary/Chest: Effort normal and breath sounds normal. She has no wheezes. She has no rales.  Abdominal: Soft.  Neurological: She is alert and oriented to person, place, and time.  Skin: Skin is warm. No rash noted.  Psychiatric: She has a normal mood and affect. Her behavior is normal.  Nursing note and vitals reviewed.  Previous notes and tests were reviewed. The plan was reviewed with the patient/family, and all questions/concerned were addressed.  It was my pleasure to see [name] today and participate in her care. Please feel free to contact me with any questions or concerns.  Sincerely,  Rexene Alberts, DO Allergy & Immunology  [facility]  [facility] office: [phone] [place] office: [place] office: [phone]

## 2019-07-22 NOTE — Assessment & Plan Note (Addendum)
Past history - Patient concerned about asthma as she noticed chest tightness, SOB, coughing and wheezing for 20 years with worsening symptoms lately. Using albuterol multiple times a day with some benefit. 2020 spirometry was normal and did not improve after bronchodilator treatment but clinically felt slightly improved.  Interim history - No significant improvement with Symbicort however feels better after using albuterol. CBC normal. . Get EKG to rule out any cardiac component of her SOB. . Get methacholine challenge breathing test which is the gold diagnostic standard for asthma diagnosis.  . Do not sleep overnight with ceiling fan on. . Daily controller medication(s): continue Symbicort 80 2 puffs twice a day with spacer and rinse mouth afterwards. . Prior to physical activity: May use albuterol rescue inhaler 2 puffs 5 to 15 minutes prior to strenuous physical activities. Marland Kitchen Rescue medications: May use albuterol rescue inhaler 2 puffs or nebulizer every 4 to 6 hours as needed for shortness of breath, chest tightness, coughing, and wheezing. Monitor frequency of use.  . If EKG and methacholine challenge are normal then get CXR, consider starting PPI for possible reflux/heartburn and/or stop Symbicort.

## 2019-07-22 NOTE — Assessment & Plan Note (Signed)
Past history - Hives with penicillin and Vicodin.   Continue to avoid for now. Consider penicillin testing in future.

## 2019-07-22 NOTE — Patient Instructions (Addendum)
Shortness of breath . Get EKG  . Get methacholine challenge breathing test.  . Do not sleep overnight with ceiling fan on. . Daily controller medication(s): continue Symbicort 80 2 puffs twice a day with spacer and rinse mouth afterwards. . Prior to physical activity: May use albuterol rescue inhaler 2 puffs 5 to 15 minutes prior to strenuous physical activities. Marland Kitchen Rescue medications: May use albuterol rescue inhaler 2 puffs or nebulizer every 4 to 6 hours as needed for shortness of breath, chest tightness, coughing, and wheezing. Monitor frequency of use.  . Control goals:  o Full participation in all desired activities (may need albuterol before activity) o Albuterol use two times or less a week on average (not counting use with activity) o Cough interfering with sleep two times or less a month o Oral steroids no more than once a year o No hospitalizations  Other allergic rhinitis Past skin testing showed: positive to grass pollen only.  Continue environmental control measures.  May use over the counter antihistamines such as Zyrtec (cetirizine), Claritin (loratadine), Allegra (fexofenadine), or Xyzal (levocetirizine) daily as needed. Take antihistamine without the decongestant in it.  Continue Singulair 10mg  tablet daily.  Continue Flonase 1 spray twice a day.  Nasal saline spray (i.e., Simply Saline) or nasal saline lavage (i.e., NeilMed) is recommended as needed and prior to medicated nasal sprays.  Follow up in 6 weeks

## 2019-07-23 ENCOUNTER — Telehealth: Payer: Self-pay

## 2019-07-23 DIAGNOSIS — Z1159 Encounter for screening for other viral diseases: Secondary | ICD-10-CM

## 2019-07-23 NOTE — Telephone Encounter (Signed)
I called Baxter Flattery at [facility] to set up PFT with methacholine challenge. She told me that they do not do the PFT and methacholine in the same day and that we would have to set up a separate appointment for each. She says that the methacholine is specifically looking for asthma and then the PFT is looking at patient's shortness of breath. I am unsure of how to proceed and I do not want to make the patient perform the wrong test. There is no order in Epic solely for a methacholine challenge, you must use the PFT order and select the methacholine. The order placed is correct. Please advise on specifically what you are looking for and thank you.

## 2019-07-24 NOTE — Telephone Encounter (Signed)
Yes that's fine 

## 2019-07-24 NOTE — Telephone Encounter (Signed)
I will call and schedule it. The patient will have to have a covid test 3 days prior to having this challenge performed though. Are you okay if I place the order?

## 2019-07-24 NOTE — Telephone Encounter (Signed)
I want the methacholine only test if this is the case.  Thank you.

## 2019-07-26 NOTE — Addendum Note (Signed)
Addended by: Garnet Sierras on: 07/26/2019 10:46 AM   Modules accepted: Orders

## 2019-07-26 NOTE — Addendum Note (Signed)
Addended by: Lucrezia Starch I on: 07/26/2019 10:37 AM   Modules accepted: Orders

## 2019-07-26 NOTE — Telephone Encounter (Signed)
I am unsure of diagnosis. Please advise.

## 2019-07-26 NOTE — Telephone Encounter (Signed)
lvm for patient to call [facility] back to discuss scheduling instructions. [phone] Baxter Flattery) is in charge of scheduling this test.

## 2019-07-26 NOTE — Telephone Encounter (Signed)
Patient informed of phone number to call and schedule the methacholine challenge.

## 2019-07-29 NOTE — Telephone Encounter (Signed)
lvm for respiratory clinic at [facility] to call back.

## 2019-07-30 NOTE — Telephone Encounter (Signed)
I spoke with Baxter Flattery and informed her that Dr. Maudie Mercury was requesting only the methacholine challenge. She has added the notes in and will await patient call back to schedule.

## 2019-08-19 ENCOUNTER — Other Ambulatory Visit: Payer: Self-pay

## 2019-08-19 MED ORDER — BUDESONIDE-FORMOTEROL FUMARATE 80-4.5 MCG/ACT IN AERO: 2.0000 | INHALATION_SPRAY | Freq: Two times a day (BID) | RESPIRATORY_TRACT | 1 refills | Status: DC

## 2019-09-30 ENCOUNTER — Encounter: Payer: Self-pay | Admitting: Allergy

## 2019-09-30 ENCOUNTER — Other Ambulatory Visit: Payer: Self-pay

## 2019-09-30 ENCOUNTER — Ambulatory Visit: Payer: BC Managed Care – PPO | Admitting: Allergy

## 2019-09-30 VITALS — BP 120/70 | HR 70 | Temp 97.7°F | Ht 59.0 in

## 2019-09-30 DIAGNOSIS — J4540 Moderate persistent asthma, uncomplicated: Secondary | ICD-10-CM

## 2019-09-30 DIAGNOSIS — H1013 Acute atopic conjunctivitis, bilateral: Secondary | ICD-10-CM | POA: Diagnosis not present

## 2019-09-30 DIAGNOSIS — J301 Allergic rhinitis due to pollen: Secondary | ICD-10-CM

## 2019-09-30 DIAGNOSIS — T50905D Adverse effect of unspecified drugs, medicaments and biological substances, subsequent encounter: Secondary | ICD-10-CM | POA: Diagnosis not present

## 2019-09-30 MED ORDER — AZELASTINE HCL 0.15 % NA SOLN: NASAL | 5 refills | Status: DC

## 2019-09-30 MED ORDER — BUDESONIDE-FORMOTEROL FUMARATE 80-4.5 MCG/ACT IN AERO: 2.0000 | INHALATION_SPRAY | Freq: Two times a day (BID) | RESPIRATORY_TRACT | 5 refills | Status: DC

## 2019-09-30 NOTE — Progress Notes (Signed)
Follow Up Note  RE: JAUNA RACZYNSKI MRN: [id number] DOB: [birth year] Date of Office Visit: 09/30/2019  Referring provider: Martinique, Betty G, MD Primary care provider: Martinique, Betty G, MD  Chief Complaint: Asthma  History of Present Illness: I had the pleasure of seeing [name] for a follow up visit at the [facility] [place] on 09/30/2019. She is a 53 y.o. female, who is being followed for allergic rhinoconjunctivitis, shortness of breath and adverse drug reactions. Today she is here for regular follow up visit. Her previous allergy office visit was on 07/22/2019 with Dr. Maudie Mercury.   Shortness of breath Patient has appointment with cardiology in October.  She thinks she had COVID in January/February but not tested at that time. She had anosmia, fevers, chills, loss of taste, SOB.  Concerned if she is having some type of sequale from it. There is strong cardiac disease history as well.   Patient still having some dry coughing in the morning but thinks it's from drainage.  She is using Symbicort 80 2 puffs BID and it seems to be helping. Using albuterol a few times a week which seems to help as well.   Stopped sleeping with the fan on. No recent CXR.  Did not get methacholine challenge test yet.  Seasonal allergic rhinitis due to pollen Currently on Flonase 1 spray BID, zyrtec 29m daily with good benefit.   Assessment and Plan: LDawsynis a 53y.o. female with: Reactive airway disease Past history - Patient concerned about asthma as she noticed chest tightness, SOB, coughing and wheezing for 20 years with worsening symptoms lately. Using albuterol multiple times a day with some benefit. 2020 spirometry was normal and did not improve after bronchodilator treatment but clinically felt slightly improved. CBC normal. Interim history - improved with Symbicort. There seems to be some reactive airway disease component of her symptoms. Did not get methacholine challenge test yet.  Concerned about cardiac issues due to positive family history and she thinks she had COVID-19 in January/February.  Daily controller medication(s):continue Symbicort 80 2 puffs twice a day with spacer and rinse mouth afterwards.  Prior to physical activity:May use albuterol rescue inhaler 2 puffs 5 to 15 minutes prior to strenuous physical activities.  Rescue medications:May use albuterol rescue inhaler 2 puffs or nebulizer every 4 to 6 hours as needed for shortness of breath, chest tightness, coughing, and wheezing. Monitor frequency of use.  Follow up with cardiology as scheduled.  . If cardiac workup and methacholine challenge are normal then get CXR, consider starting PPI for possible reflux/heartburn.  Seasonal allergic rhinitis due to pollen Past history - Rhino conjunctivitis symptoms for the past 20 years during the spring through fall. Tried Claritin-D, Singulair and Flonase with some benefit. 2020 skin testing showed: positive to grass pollen only. Interim history - Singulair causing drowsiness in the am, still having PND.  Start azelastine nasal spray 1-2 sprays per nostril twice a day as needed for drainage.   Continue environmental control measures.  May use over the counter antihistamines such as Zyrtec (cetirizine), Claritin (loratadine), Allegra (fexofenadine), or Xyzal (levocetirizine) daily as needed. Take antihistamine without the decongestant in it.  Stop Singulair125mtablet daily.  Continue Flonase 1 spray twice a day.  Nasal saline spray (i.e., Simply Saline) or nasal saline lavage (i.e., NeilMed) is recommended as needed and prior to medicated nasal sprays.  Allergic conjunctivitis of both eyes  See assessment and plan as above allergic rhinitis.  Drug reaction Past  history - Hives with penicillin and Vicodin.   Continue to avoid for now. Consider penicillin testing in future.   Return in about 2 months (around 11/30/2019).  Meds ordered this encounter   Medications  . Azelastine HCl 0.15 % SOLN    Sig: Use 1-2 sprays per nostril twice a day as needed for drainage.    Dispense:  30 mL    Refill:  5  . budesonide-formoterol (SYMBICORT) 80-4.5 MCG/ACT inhaler    Sig: Inhale 2 puffs into the lungs 2 (two) times daily.    Dispense:  10.2 g    Refill:  5   Diagnostics: Spirometry:  Declines.   Medication List:  Current Outpatient Medications  Medication Sig Dispense Refill  . Blood Glucose Monitoring Suppl (ACCU-CHEK AVIVA PLUS) w/Device KIT 1 Device by Does not apply route daily. 1 kit 0  . budesonide-formoterol (SYMBICORT) 80-4.5 MCG/ACT inhaler Inhale 2 puffs into the lungs 2 (two) times daily. 10.2 g 5  . fexofenadine (ALLEGRA) 180 MG tablet Take 180 mg by mouth daily.    . fluticasone (FLONASE) 50 MCG/ACT nasal spray Place 2 sprays into both nostrils 2 (two) times a day.    Marland Kitchen glucose blood (ACCU-CHEK AVIVA PLUS) test strip 2 times daily 180 each 1  . Lancets (ACCU-CHEK SOFT TOUCH) lancets 2/day 180 each 12  . Magnesium Gluconate (MAG-G) 500 (27 Mg) MG TABS Take by mouth.    Marland Kitchen PROAIR HFA 108 (90 Base) MCG/ACT inhaler INHALE 2 PUFFS INTO THE LUNGS EVERY 6 HOURS AS NEEDED FOR WHEEZING OR SHORTNESS OF BREATH 18 g 2  . SUMAtriptan (IMITREX) 50 MG tablet Take 50 mg by mouth every 2 (two) hours as needed for migraine. May repeat in 2 hours if headache persists or recurs.    . Azelastine HCl 0.15 % SOLN Use 1-2 sprays per nostril twice a day as needed for drainage. 30 mL 5   No current facility-administered medications for this visit.    Allergies: Allergies  Allergen Reactions  . Citrus Hives  . Penicillins Hives  . Vicodin [Hydrocodone-Acetaminophen] Hives  . Topiramate    I reviewed her past medical history, social history, family history, and environmental history and no significant changes have been reported from her previous visit.  Review of Systems  Constitutional: Negative for appetite change, chills, fever and unexpected  weight change.  HENT: Positive for postnasal drip. Negative for congestion and rhinorrhea.   Eyes: Negative for itching.  Respiratory: Positive for cough and shortness of breath. Negative for chest tightness and wheezing.   Cardiovascular: Negative for chest pain.  Gastrointestinal: Negative for abdominal pain.  Genitourinary: Negative for difficulty urinating.  Skin: Negative for rash.  Allergic/Immunologic: Positive for environmental allergies. Negative for food allergies.  Neurological: Positive for headaches.   Objective: BP 120/70   Pulse 70   Temp 97.7 F (36.5 C) (Temporal)   Ht '4\' 11"'  (1.499 m)   SpO2 92%   BMI 37.57 kg/m  Body mass index is 37.57 kg/m. Physical Exam  Constitutional: She is oriented to person, place, and time. She appears well-developed and well-nourished.  HENT:  Head: Normocephalic and atraumatic.  Right Ear: External ear normal.  Left Ear: External ear normal.  Nose: Nose normal.  Mouth/Throat: Oropharynx is clear and moist.  Eyes: Conjunctivae and EOM are normal.  Neck: Neck supple.  Cardiovascular: Normal rate, regular rhythm and normal heart sounds. Exam reveals no gallop and no friction rub.  No murmur heard. Pulmonary/Chest: Effort normal and breath  sounds normal. She has no wheezes. She has no rales.  Abdominal: Soft.  Neurological: She is alert and oriented to person, place, and time.  Skin: Skin is warm. No rash noted.  Psychiatric: She has a normal mood and affect. Her behavior is normal.  Nursing note and vitals reviewed.  Previous notes and tests were reviewed. The plan was reviewed with the patient/family, and all questions/concerned were addressed.  It was my pleasure to see Ceairra today and participate in her care. Please feel free to contact me with any questions or concerns.  Sincerely,  Rexene Alberts, DO Allergy & Immunology  [facility] office: [phone] [facility] office:  [place] office: [phone]

## 2019-09-30 NOTE — Assessment & Plan Note (Signed)
Past history - Patient concerned about asthma as she noticed chest tightness, SOB, coughing and wheezing for 20 years with worsening symptoms lately. Using albuterol multiple times a day with some benefit. 2020 spirometry was normal and did not improve after bronchodilator treatment but clinically felt slightly improved. CBC normal. Interim history - improved with Symbicort. There seems to be some reactive airway disease component of her symptoms. Did not get methacholine challenge test yet. Concerned about cardiac issues due to positive family history and she thinks she had COVID-19 in January/February.  Daily controller medication(s):continue Symbicort 80 2 puffs twice a day with spacer and rinse mouth afterwards.  Prior to physical activity:May use albuterol rescue inhaler 2 puffs 5 to 15 minutes prior to strenuous physical activities.  Rescue medications:May use albuterol rescue inhaler 2 puffs or nebulizer every 4 to 6 hours as needed for shortness of breath, chest tightness, coughing, and wheezing. Monitor frequency of use.  Follow up with cardiology as scheduled.  . If cardiac workup and methacholine challenge are normal then get CXR, consider starting PPI for possible reflux/heartburn.

## 2019-09-30 NOTE — Assessment & Plan Note (Signed)
Past history - Hives with penicillin and Vicodin.   Continue to avoid for now. Consider penicillin testing in future.

## 2019-09-30 NOTE — Patient Instructions (Addendum)
Follow up with cardiology as scheduled.   Reactive airway disease.  Daily controller medication(s):continue Symbicort 80 2 puffs twice a day with spacer and rinse mouth afterwards.  Prior to physical activity:May use albuterol rescue inhaler 2 puffs 5 to 15 minutes prior to strenuous physical activities.  Rescue medications:May use albuterol rescue inhaler 2 puffs or nebulizer every 4 to 6 hours as needed for shortness of breath, chest tightness, coughing, and wheezing. Monitor frequency of use.   Breathing  control goals:  Full participation in all desired activities (may need albuterol before activity) Albuterol use two times or less a week on average (not counting use with activity) Cough interfering with sleep two times or less a month Oral steroids no more than once a year No hospitalizations  Seasonal allergic rhinitis due to pollen 2020 skin testing showed: positive to grass pollen only.  Start azelastine nasal spray 1-2 sprays per nostril twice a day as needed for drainage.   Continue environmental control measures.  May use over the counter antihistamines such as Zyrtec (cetirizine), Claritin (loratadine), Allegra (fexofenadine), or Xyzal (levocetirizine) daily as needed. Take antihistamine without the decongestant in it.  Stop Singulair10mg  tablet daily.  Continue Flonase 1 spray twice a day.  Nasal saline spray (i.e., Simply Saline) or nasal saline lavage (i.e., NeilMed) is recommended as needed and prior to medicated nasal sprays.  Drug reaction Past history - Hives with penicillin and Vicodin.   Continue to avoid for now. Consider penicillin testing in future.   Follow up in 2 months or sooner if needed.

## 2019-09-30 NOTE — Assessment & Plan Note (Signed)
   See assessment and plan as above allergic rhinitis. 

## 2019-09-30 NOTE — Assessment & Plan Note (Signed)
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 Tried Claritin-D, Singulair and Flonase with some benefit. 2020 skin testing showed: positive to grass pollen only. Interim history - Singulair causing drowsiness in the am, still having PND.  Start azelastine nasal spray 1-2 sprays per nostril twice a day as needed for drainage.   Continue environmental control measures.  May use over the counter antihistamines such as Zyrtec (cetirizine), Claritin (loratadine), Allegra (fexofenadine), or Xyzal (levocetirizine) daily as needed. Take antihistamine without the decongestant in it.  Stop Singulair10mg  tablet daily.  Continue Flonase 1 spray twice a day.  Nasal saline spray (i.e., Simply Saline) or nasal saline lavage (i.e., NeilMed) is recommended as needed and prior to medicated nasal sprays.

## 2019-10-18 DIAGNOSIS — Z6835 Body mass index (BMI) 35.0-35.9, adult: Secondary | ICD-10-CM | POA: Diagnosis not present

## 2019-10-18 DIAGNOSIS — M25521 Pain in right elbow: Secondary | ICD-10-CM | POA: Diagnosis not present

## 2019-10-21 ENCOUNTER — Ambulatory Visit: Payer: BC Managed Care – PPO | Admitting: Cardiovascular Disease

## 2019-10-21 ENCOUNTER — Other Ambulatory Visit: Payer: Self-pay

## 2019-10-21 ENCOUNTER — Encounter: Payer: Self-pay | Admitting: Cardiovascular Disease

## 2019-10-21 VITALS — BP 150/80 | HR 63 | Ht 59.0 in | Wt 188.4 lb

## 2019-10-21 DIAGNOSIS — E1169 Type 2 diabetes mellitus with other specified complication: Secondary | ICD-10-CM

## 2019-10-21 DIAGNOSIS — E669 Obesity, unspecified: Secondary | ICD-10-CM

## 2019-10-21 DIAGNOSIS — R072 Precordial pain: Secondary | ICD-10-CM | POA: Diagnosis not present

## 2019-10-21 DIAGNOSIS — R0789 Other chest pain: Secondary | ICD-10-CM

## 2019-10-21 DIAGNOSIS — R0602 Shortness of breath: Secondary | ICD-10-CM | POA: Diagnosis not present

## 2019-10-21 DIAGNOSIS — I10 Essential (primary) hypertension: Secondary | ICD-10-CM

## 2019-10-21 MED ORDER — METOPROLOL TARTRATE 50 MG PO TABS: ORAL_TABLET | ORAL | 0 refills | Status: DC

## 2019-10-21 NOTE — Progress Notes (Signed)
Cardiology Office Note   Date:  10/25/2019   ID:  Estie, Sproule 01-04-1966, MRN [id number]  PCP:  [place], Betty G, MD  Cardiologist:   Skeet Latch, MD   Chief Complaint  Patient presents with  . Shortness of Breath     History of Present Illness: [name] is a 53 y.o. female who is being seen today for the evaluation of shortness of breath at the request of [place], Betty G, MD.  [name] thinks that she had the coronavirus in January or February.  She has needed to use albuterol incessantly since that time period. She had to use the inhaler just to be able ot walk around her home.  Since then she has noticed chest tightness, shortness of breath, cough, and wheezing.  She also has a strong family history of cardiac disease and therefore was referred to cardiology for further evaluation.  In general she likes to walk for exercise.  However lately she has gained weight, which she attributes to being home for COVID.  She notices that as she walks up hills lately she is short of breath and has pain in her L shoulder.  Symptoms improve with exertion.  There is no nausea or diaphoresis.  She denies lower extremity edema, orthopnea or PND.  She also reports pain in her R calf.  She wakes up at night with a knot in her calf.  It hasn't improved with potassium or magnesium supplementation.  She never has it with walking.     Past Medical History:  Diagnosis Date  . Allergy   . Arthritis   . Atypical chest pain 10/25/2019  . Diabetes mellitus without complication ([place])    2878  . Essential hypertension 10/25/2019  . Family history of anesthesia complication    "Mama did; she has alzheimer's; any anesthesia puts her in the twilight zone"  . Gallstones   . GERD (gastroesophageal reflux disease)   . Migraine    "cluster migraine"  . Sciatic nerve injury    "right side"    Past Surgical History:  Procedure Laterality Date  . ANTERIOR CRUCIATE LIGAMENT REPAIR Right 1996  .  CHOLECYSTECTOMY N/A 08/31/2014   Procedure: LAPAROSCOPIC CHOLECYSTECTOMY WITH INTRAOPERATIVE CHOLANGIOGRAM;  Surgeon: Greer Pickerel, MD;  Location: [facility];  Service: General;  Laterality: N/A;  . KNEE ARTHROSCOPY Right 1998   with ACL reconstruction  . LAPAROSCOPIC CHOLECYSTECTOMY  08/31/2014  . SINOSCOPY  1998  . TONSILLECTOMY  ~ 1978     Current Outpatient Medications  Medication Sig Dispense Refill  . Azelastine HCl 0.15 % SOLN Use 1-2 sprays per nostril twice a day as needed for drainage. 30 mL 5  . Blood Glucose Monitoring Suppl (ACCU-CHEK AVIVA PLUS) w/Device KIT 1 Device by Does not apply route daily. 1 kit 0  . budesonide-formoterol (SYMBICORT) 80-4.5 MCG/ACT inhaler Inhale 2 puffs into the lungs 2 (two) times daily. 10.2 g 5  . fexofenadine (ALLEGRA) 180 MG tablet Take 180 mg by mouth daily.    . fluticasone (FLONASE) 50 MCG/ACT nasal spray Place 2 sprays into both nostrils 2 (two) times a day.    Marland Kitchen glucose blood (ACCU-CHEK AVIVA PLUS) test strip 2 times daily 180 each 1  . Lancets (ACCU-CHEK SOFT TOUCH) lancets 2/day 180 each 12  . Magnesium Gluconate (MAG-G) 500 (27 Mg) MG TABS Take by mouth.    Marland Kitchen PROAIR HFA 108 (90 Base) MCG/ACT inhaler INHALE 2 PUFFS INTO THE LUNGS EVERY 6 HOURS  AS NEEDED FOR WHEEZING OR SHORTNESS OF BREATH 18 g 2  . SUMAtriptan (IMITREX) 50 MG tablet Take 50 mg by mouth every 2 (two) hours as needed for migraine. May repeat in 2 hours if headache persists or recurs.    . metoprolol tartrate (LOPRESSOR) 50 MG tablet TAKE 1 TABLET BY MOUTH 2 HOURS PRIOR TO CT 1 tablet 0   No current facility-administered medications for this visit.     Allergies:   Citrus, Penicillins, Vicodin [hydrocodone-acetaminophen], and Topiramate    Social History:  The patient  reports that she has never smoked. She has never used smokeless tobacco. She reports that she does not drink alcohol or use drugs.   Family History:  The patient's family history includes Cancer in her father;  Dementia in her mother; Diabetes in her daughter and mother; Heart attack in her father and maternal grandfather; Hyperlipidemia in her daughter; Hypertension in her daughter; Stroke in her mother.    ROS:  Please see the history of present illness.   Otherwise, review of systems are positive for none.   All other systems are reviewed and negative.    PHYSICAL EXAM: VS:  BP (!) 150/80   Pulse 63   Ht 4' 11" (1.499 m)   Wt 188 lb 6.4 oz (85.5 kg)   SpO2 100%   BMI 38.05 kg/m  , BMI Body mass index is 38.05 kg/m. GENERAL:  Well appearing HEENT:  Pupils equal round and reactive, fundi not visualized, oral mucosa unremarkable NECK:  No jugular venous distention, waveform within normal limits, carotid upstroke brisk and symmetric, no bruit LUNGS:  Clear to auscultation bilaterally HEART:  RRR.  PMI not displaced or sustained,S1 and S2 within normal limits, no S3, no S4, no clicks, no rubs, no  murmurs ABD:  Flat, positive bowel sounds normal in frequency in pitch, no bruits, no rebound, no guarding, no midline pulsatile mass, no hepatomegaly, no splenomegaly EXT:  2 plus pulses throughout, no edema, no cyanosis no clubbing SKIN:  No rashes no nodules NEURO:  Cranial nerves II through XII grossly intact, motor grossly intact throughout PSYCH:  Cognitively intact, oriented to person place and time   EKG:  EKG is ordered today. The ekg ordered today demonstrates sinus rhythm.  Rate 63 bpm.  Non-specific ST-T changes.     Recent Labs: 06/19/2019: ALT 69; BUN 10; Creatinine, Ser 0.89; Hemoglobin 14.5; Platelets 260; Potassium 4.2; Sodium 141; TSH 3.290    Lipid Panel No results found for: CHOL, TRIG, HDL, CHOLHDL, VLDL, LDLCALC, LDLDIRECT    Wt Readings from Last 3 Encounters:  10/21/19 188 lb 6.4 oz (85.5 kg)  06/19/19 186 lb (84.4 kg)  12/20/18 185 lb (83.9 kg)      ASSESSMENT AND PLAN:  # Atypical chest pain: Symptoms are very atypical.  However she does have risk factors for  CAD.  We will get a coronary CT-A to better assess.  We will also check liipids and a CMP.  # Shortness of breath:  She is euvolemic on exam.  This may be attributable to diastolic dysfunction and obesity.  Check echo.  # Essential hypertension: BP was elevated both initially and on repeat.  She doesn't like taking medication.  We discussed the importance of getting 150 minutes of exercise weekly and weight loss.  We also discussed limiting salt intake.  She wants to work on this for 3 months.  She was given information on the DASH diet.    Current medicines are   reviewed at length with the patient today.  The patient does not have concerns regarding medicines.  The following changes have been made:  no change  Labs/ tests ordered today include:   Orders Placed This Encounter  Procedures  . CT CORONARY MORPH W/CTA COR W/SCORE W/CA W/CM &/OR WO/CM  . CT CORONARY FRACTIONAL FLOW RESERVE DATA PREP  . CT CORONARY FRACTIONAL FLOW RESERVE FLUID ANALYSIS  . Lipid panel  . Comprehensive metabolic panel  . EKG 12-Lead  . ECHOCARDIOGRAM COMPLETE     Disposition:   FU with Edrik Rundle C. Oval Linsey, MD, [facility] in 3 months.     Signed, Najir Roop C. Oval Linsey, MD, [facility]  10/25/2019 12:58 PM    [place]

## 2019-10-21 NOTE — Patient Instructions (Addendum)
Medication Instructions:  TAKE METOPROLOL 50 MG 2 HOURS PRIOR TO YOUR CT  *If you need a refill on your cardiac medications before your next appointment, please call your pharmacy*  Lab Work: FASTING LP/CMET 1 WEEK PRIOR TO CARDIAC CT  If you have labs (blood work) drawn today and your tests are completely normal, you will receive your results only by: Marland Kitchen. MyChart Message (if you have MyChart) OR . A paper copy in the mail If you have any lab test that is abnormal or we need to change your treatment, we will call you to review the results.  Testing/Procedures: Your physician has requested that you have an echocardiogram. Echocardiography is a painless test that uses sound waves to create images of your heart. It provides your doctor with information about the size and shape of your heart and how well your heart's chambers and valves are working. This procedure takes approximately one hour. There are no restrictions for this procedure. [facility] AT [address]  Your physician has requested that you have cardiac CT. Cardiac computed tomography (CT) is a painless test that uses an x-ray machine to take clear, detailed pictures of your heart. For further information please visit [email or web address]. Please follow instruction sheet as given. THE OFFICE WILL CALL YOU TO SCHEDULE ONCE YOUR INSURANCE HAS APPROVED. CALL THE OFFICE IF YOU DO NOT HEAR FROM US IN 2 WEEKS [phone]  Follow-Up: At [facility], you and your health needs are our priority.  As part of our continuing mission to provide you with exceptional heart care, we have created designated Provider Care Teams.  These Care Teams include your primary Cardiologist (physician) and Advanced Practice Providers (APPs -  Physician Assistants and Nurse Practitioners) who all work together to provide you with the care you need, when you need it.  Your next appointment:   3 months  The format for your next appointment:   In  Person  Provider:   You may see [facility]  or one of the following Advanced Practice Providers on your designated Care Team:    Corine ShelterLuke Kilroy, PA-C  Jordan Hillallie Goodrich, [place]-C  Edd FabianJesse Cleaver, [place]   Other Instruction  EXERCISE 150 MINUTES [facility]   Your cardiac CT will be scheduled at one of the below locations:   [facility] [address] [place], [place] [address] [phone]  OR  [facility] [address] [place], [place] [address] [phone]  If scheduled at [facility], please arrive at the [facility] main entrance of [facility] 30-45 minutes prior to test start time. Proceed to the [facility] Radiology Department (first floor) to check-in and test prep.  If scheduled at [facility], please arrive 15 mins early for check-in and test prep.  Please follow these instructions carefully (unless otherwise directed):  Hold all erectile dysfunction medications at least 3 days (72 hrs) prior to test.  On the Night Before the Test: . Be sure to Drink plenty of water. . Do not consume any caffeinated/decaffeinated beverages or chocolate 12 hours prior to your test. . Do not take any antihistamines 12 hours prior to your test. . If the patient has contrast allergy: ? Patient will need a prescription for Prednisone and very clear instructions (as follows): 1. Prednisone 50 mg - take 13 hours prior to test 2. Take another Prednisone 50 mg 7 hours prior to test 3. Take another Prednisone 50  mg 1 hour prior to test 4. Take Benadryl 50 mg 1 hour prior to test . Patient must complete all four doses of above prophylactic medications. . Patient will need a ride after test due to Benadryl.  On the Day of the Test: . Drink plenty of water. Do not drink any water within one hour of the test. . Do not eat any food 4 hours prior to the test. . You may take your regular  medications prior to the test.  . Take metoprolol (Lopressor) two hours prior to test. . HOLD Furosemide/Hydrochlorothiazide morning of the test. . FEMALES- please wear underwire-free bra if available  After the Test: . Drink plenty of water. . After receiving IV contrast, you may experience a mild flushed feeling. This is normal. . On occasion, you may experience a mild rash up to 24 hours after the test. This is not dangerous. If this occurs, you can take Benadryl 25 mg and increase your fluid intake. . If you experience trouble breathing, this can be serious. If it is severe call 911 IMMEDIATELY. If it is mild, please call our office. . If you take any of these medications: Glipizide/Metformin, Avandament, Glucavance, please do not take 48 hours after completing test unless otherwise instructed.   Once we have confirmed authorization from your insurance company, we will call you to set up a date and time for your test.   For non-scheduling related questions, please contact the cardiac imaging nurse navigator should you have any questions/concerns: Rockwell Alexandria, RN Navigator Cardiac Imaging [facility] [phone] Office    Cardiac CT Angiogram  A cardiac CT angiogram is a procedure to look at the heart and the area around the heart. It may be done to help find the cause of chest pains or other symptoms of heart disease. During this procedure, a large X-ray machine, called a CT scanner, takes detailed pictures of the heart and the surrounding area after a dye (contrast material) has been injected into blood vessels in the area. The procedure is also sometimes called a coronary CT angiogram, coronary artery scanning, or CTA. A cardiac CT angiogram allows the health care provider to see how well blood is flowing to and from the heart. The health care provider will be able to see if there are any problems, such as:  Blockage or narrowing of the coronary arteries  in the heart.  Fluid around the heart.  Signs of weakness or disease in the muscles, valves, and tissues of the heart. Tell a health care provider about:  Any allergies you have. This is especially important if you have had a previous allergic reaction to contrast dye.  All medicines you are taking, including vitamins, herbs, eye drops, creams, and over-the-counter medicines.  Any blood disorders you have.  Any surgeries you have had.  Any medical conditions you have.  Whether you are pregnant or may be pregnant.  Any anxiety disorders, chronic pain, or other conditions you have that may increase your stress or prevent you from lying still. What are the risks? Generally, this is a safe procedure. However, problems may occur, including:  Bleeding.  Infection.  Allergic reactions to medicines or dyes.  Damage to other structures or organs.  Kidney damage from the dye or contrast that is used.  Increased risk of cancer from radiation exposure. This risk is low. Talk with your health care provider about: ? The risks and benefits of testing. ? How you can receive  the lowest dose of radiation. What happens before the procedure?  Wear comfortable clothing and remove any jewelry, glasses, dentures, and hearing aids.  Follow instructions from your health care provider about eating and drinking. This may include: ? For 12 hours before the test - avoid caffeine. This includes tea, coffee, soda, energy drinks, and diet pills. Drink plenty of water or other fluids that do not have caffeine in them. Being well-hydrated can prevent complications. ? For 4-6 hours before the test - stop eating and drinking. The contrast dye can cause nausea, but this is less likely if your stomach is empty.  Ask your health care provider about changing or stopping your regular medicines. This is especially important if you are taking diabetes medicines, blood thinners, or medicines to treat erectile  dysfunction. What happens during the procedure?  Hair on your chest may need to be removed so that small sticky patches called electrodes can be placed on your chest. These will transmit information that helps to monitor your heart during the test.  An IV tube will be inserted into one of your veins.  You might be given a medicine to control your heart rate during the test. This will help to ensure that good images are obtained.  You will be asked to lie on an exam table. This table will slide in and out of the CT machine during the procedure.  Contrast dye will be injected into the IV tube. You might feel warm, or you may get a metallic taste in your mouth.  You will be given a medicine (nitroglycerin) to relax (dilate) the arteries in your heart.  The table that you are lying on will move into the CT machine tunnel for the scan.  The person running the machine will give you instructions while the scans are being done. You may be asked to: ? Keep your arms above your head. ? Hold your breath. ? Stay very still, even if the table is moving.  When the scanning is complete, you will be moved out of the machine.  The IV tube will be removed. The procedure may vary among health care providers and hospitals. What happens after the procedure?  You might feel warm, or you may get a metallic taste in your mouth from the contrast dye.  You may have a headache from the nitroglycerin.  After the procedure, drink water or other fluids to wash (flush) the contrast material out of your body.  Contact a health care provider if you have any symptoms of allergy to the contrast. These symptoms include: ? Shortness of breath. ? Rash or hives. ? A racing heartbeat.  Most people can return to their normal activities right after the procedure. Ask your health care provider what activities are safe for you.  It is up to you to get the results of your procedure. Ask your health care provider, or the  department that is doing the procedure, when your results will be ready. Summary  A cardiac CT angiogram is a procedure to look at the heart and the area around the heart. It may be done to help find the cause of chest pains or other symptoms of heart disease.  During this procedure, a large X-ray machine, called a CT scanner, takes detailed pictures of the heart and the surrounding area after a dye (contrast material) has been injected into blood vessels in the area.  Ask your health care provider about changing or stopping your regular medicines before the  procedure. This is especially important if you are taking diabetes medicines, blood thinners, or medicines to treat erectile dysfunction.  After the procedure, drink water or other fluids to wash (flush) the contrast material out of your body. This information is not intended to replace advice given to you by your health care provider. Make sure you discuss any questions you have with your health care provider. Document Released: 11/24/2008 Document Revised: 11/24/2017 Document Reviewed: 10/31/2016 Elsevier Patient Education  2020 Elsevier Inc.     DASH Eating Plan DASH stands for "Dietary Approaches to Stop Hypertension." The DASH eating plan is a healthy eating plan that has been shown to reduce high blood pressure (hypertension). It may also reduce your risk for type 2 diabetes, heart disease, and stroke. The DASH eating plan may also help with weight loss. What are tips for following this plan?  General guidelines  Avoid eating more than 2,300 mg (milligrams) of salt (sodium) a day. If you have hypertension, you may need to reduce your sodium intake to 1,500 mg a day.  Limit alcohol intake to no more than 1 drink a day for nonpregnant women and 2 drinks a day for men. One drink equals 12 oz of beer, 5 oz of wine, or 1 oz of hard liquor.  Work with your health care provider to maintain a healthy body weight or to lose weight. Ask  what an ideal weight is for you.  Get at least 30 minutes of exercise that causes your heart to beat faster (aerobic exercise) most days of the week. Activities may include walking, swimming, or biking.  Work with your health care provider or diet and nutrition specialist (dietitian) to adjust your eating plan to your individual calorie needs. Reading food labels   Check food labels for the amount of sodium per serving. Choose foods with less than 5 percent of the Daily Value of sodium. Generally, foods with less than 300 mg of sodium per serving fit into this eating plan.  To find whole grains, look for the word "whole" as the first word in the ingredient list. Shopping  Buy products labeled as "low-sodium" or "no salt added."  Buy fresh foods. Avoid canned foods and premade or frozen meals. Cooking  Avoid adding salt when cooking. Use salt-free seasonings or herbs instead of table salt or sea salt. Check with your health care provider or pharmacist before using salt substitutes.  Do not fry foods. Cook foods using healthy methods such as baking, boiling, grilling, and broiling instead.  Cook with heart-healthy oils, such as olive, canola, soybean, or sunflower oil. Meal planning  Eat a balanced diet that includes: ? 5 or more servings of fruits and vegetables each day. At each meal, try to fill half of your plate with fruits and vegetables. ? Up to 6-8 servings of whole grains each day. ? Less than 6 oz of lean meat, poultry, or fish each day. A 3-oz serving of meat is about the same size as a deck of cards. One egg equals 1 oz. ? 2 servings of low-fat dairy each day. ? A serving of nuts, seeds, or beans 5 times each week. ? Heart-healthy fats. Healthy fats called Omega-3 fatty acids are found in foods such as flaxseeds and coldwater fish, like sardines, salmon, and mackerel.  Limit how much you eat of the following: ? Canned or prepackaged foods. ? Food that is high in trans  fat, such as fried foods. ? Food that is high in saturated  fat, such as fatty meat. ? Sweets, desserts, sugary drinks, and other foods with added sugar. ? Full-fat dairy products.  Do not salt foods before eating.  Try to eat at least 2 vegetarian meals each week.  Eat more home-cooked food and less restaurant, buffet, and fast food.  When eating at a restaurant, ask that your food be prepared with less salt or no salt, if possible. What foods are recommended? The items listed may not be a complete list. Talk with your dietitian about what dietary choices are best for you. Grains Whole-grain or whole-wheat bread. Whole-grain or whole-wheat pasta. Brown rice. Orpah Cobb. Bulgur. Whole-grain and low-sodium cereals. Pita bread. Low-fat, low-sodium crackers. Whole-wheat flour tortillas. Vegetables Fresh or frozen vegetables (raw, steamed, roasted, or grilled). Low-sodium or reduced-sodium tomato and vegetable juice. Low-sodium or reduced-sodium tomato sauce and tomato paste. Low-sodium or reduced-sodium canned vegetables. Fruits All fresh, dried, or frozen fruit. Canned fruit in natural juice (without added sugar). Meat and other protein foods Skinless chicken or [place]. Ground chicken or [place]. Pork with fat trimmed off. Fish and seafood. Egg whites. Dried beans, peas, or lentils. Unsalted nuts, nut butters, and seeds. Unsalted canned beans. Lean cuts of beef with fat trimmed off. Low-sodium, lean deli meat. Dairy Low-fat (1%) or fat-free (skim) milk. Fat-free, low-fat, or reduced-fat cheeses. Nonfat, low-sodium ricotta or cottage cheese. Low-fat or nonfat yogurt. Low-fat, low-sodium cheese. Fats and oils Soft margarine without trans fats. Vegetable oil. Low-fat, reduced-fat, or light mayonnaise and salad dressings (reduced-sodium). Canola, safflower, olive, soybean, and sunflower oils. Avocado. Seasoning and other foods Herbs. Spices. Seasoning mixes without salt. Unsalted popcorn and  pretzels. Fat-free sweets. What foods are not recommended? The items listed may not be a complete list. Talk with your dietitian about what dietary choices are best for you. Grains Baked goods made with fat, such as croissants, muffins, or some breads. Dry pasta or rice meal packs. Vegetables Creamed or fried vegetables. Vegetables in a cheese sauce. Regular canned vegetables (not low-sodium or reduced-sodium). Regular canned tomato sauce and paste (not low-sodium or reduced-sodium). Regular tomato and vegetable juice (not low-sodium or reduced-sodium). Rosita Fire. Olives. Fruits Canned fruit in a light or heavy syrup. Fried fruit. Fruit in cream or butter sauce. Meat and other protein foods Fatty cuts of meat. Ribs. Fried meat. Tomasa Blase. Sausage. Bologna and other processed lunch meats. Salami. Fatback. Hotdogs. Bratwurst. Salted nuts and seeds. Canned beans with added salt. Canned or smoked fish. Whole eggs or egg yolks. Chicken or [place] with skin. Dairy Whole or 2% milk, cream, and half-and-half. Whole or full-fat cream cheese. Whole-fat or sweetened yogurt. Full-fat cheese. Nondairy creamers. Whipped toppings. Processed cheese and cheese spreads. Fats and oils Butter. Stick margarine. Lard. Shortening. Ghee. Bacon fat. Tropical oils, such as coconut, palm kernel, or palm oil. Seasoning and other foods Salted popcorn and pretzels. Onion salt, garlic salt, seasoned salt, table salt, and sea salt. Worcestershire sauce. Tartar sauce. Barbecue sauce. Teriyaki sauce. Soy sauce, including reduced-sodium. Steak sauce. Canned and packaged gravies. Fish sauce. Oyster sauce. Cocktail sauce. Horseradish that you find on the shelf. Ketchup. Mustard. Meat flavorings and tenderizers. Bouillon cubes. Hot sauce and Tabasco sauce. Premade or packaged marinades. Premade or packaged taco seasonings. Relishes. Regular salad dressings. Where to find more information:  National Heart, Lung, and Blood Institute:  PopSteam.is  American Heart Association: www.heart.org Summary  The DASH eating plan is a healthy eating plan that has been shown to reduce high blood pressure (hypertension). It may also reduce  your risk for type 2 diabetes, heart disease, and stroke.  With the DASH eating plan, you should limit salt (sodium) intake to 2,300 mg a day. If you have hypertension, you may need to reduce your sodium intake to 1,500 mg a day.  When on the DASH eating plan, aim to eat more fresh fruits and vegetables, whole grains, lean proteins, low-fat dairy, and heart-healthy fats.  Work with your health care provider or diet and nutrition specialist (dietitian) to adjust your eating plan to your individual calorie needs. This information is not intended to replace advice given to you by your health care provider. Make sure you discuss any questions you have with your health care provider. Document Released: 12/01/2011 Document Revised: 11/24/2017 Document Reviewed: 12/05/2016 Elsevier Patient Education  2020 [employer].

## 2019-10-25 ENCOUNTER — Encounter: Payer: Self-pay | Admitting: Cardiovascular Disease

## 2019-10-25 DIAGNOSIS — R0789 Other chest pain: Secondary | ICD-10-CM

## 2019-10-25 DIAGNOSIS — I10 Essential (primary) hypertension: Secondary | ICD-10-CM

## 2019-10-25 HISTORY — DX: Essential (primary) hypertension: I10

## 2019-10-25 HISTORY — DX: Other chest pain: R07.89

## 2019-11-01 ENCOUNTER — Ambulatory Visit (HOSPITAL_COMMUNITY): Payer: BC Managed Care – PPO | Attending: Cardiology

## 2019-11-01 ENCOUNTER — Other Ambulatory Visit: Payer: Self-pay

## 2019-11-01 DIAGNOSIS — R0602 Shortness of breath: Secondary | ICD-10-CM | POA: Insufficient documentation

## 2019-11-01 DIAGNOSIS — R072 Precordial pain: Secondary | ICD-10-CM | POA: Diagnosis not present

## 2019-11-01 DIAGNOSIS — I340 Nonrheumatic mitral (valve) insufficiency: Secondary | ICD-10-CM | POA: Insufficient documentation

## 2019-11-11 ENCOUNTER — Telehealth: Payer: Self-pay | Admitting: *Deleted

## 2019-11-11 NOTE — Telephone Encounter (Signed)
PA for Symbicort 80 has been submitted through CoverMyMeds and is currently pending approval or denial.

## 2019-11-12 NOTE — Telephone Encounter (Signed)
Received fax from insurance that authorization is not required for Symbicort on this patient's insurance plan and that per the pharmacy system the patient filled this request on 10/14/2019.

## 2019-11-25 ENCOUNTER — Other Ambulatory Visit: Payer: Self-pay

## 2019-11-25 ENCOUNTER — Encounter: Payer: Self-pay | Admitting: Allergy

## 2019-11-25 ENCOUNTER — Ambulatory Visit: Payer: BC Managed Care – PPO | Admitting: Allergy

## 2019-11-25 VITALS — BP 128/82 | HR 72 | Temp 97.4°F

## 2019-11-25 DIAGNOSIS — T50905D Adverse effect of unspecified drugs, medicaments and biological substances, subsequent encounter: Secondary | ICD-10-CM

## 2019-11-25 DIAGNOSIS — J301 Allergic rhinitis due to pollen: Secondary | ICD-10-CM

## 2019-11-25 DIAGNOSIS — J4540 Moderate persistent asthma, uncomplicated: Secondary | ICD-10-CM

## 2019-11-25 DIAGNOSIS — T781XXD Other adverse food reactions, not elsewhere classified, subsequent encounter: Secondary | ICD-10-CM

## 2019-11-25 DIAGNOSIS — H1013 Acute atopic conjunctivitis, bilateral: Secondary | ICD-10-CM

## 2019-11-25 DIAGNOSIS — T781XXA Other adverse food reactions, not elsewhere classified, initial encounter: Secondary | ICD-10-CM | POA: Insufficient documentation

## 2019-11-25 MED ORDER — CARBINOXAMINE MALEATE 6 MG PO TABS: 1.0000 | ORAL_TABLET | Freq: Two times a day (BID) | ORAL | 3 refills | Status: DC

## 2019-11-25 NOTE — Assessment & Plan Note (Signed)
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 Tried Claritin-D, Singulair and Flonase with some benefit. 2020 skin testing showed: positive to grass pollen only. Interim history - stopped Singulair with no worsening symptoms. Zyrtec and allegra not effective. Still having rhinorrhea despite using azelastine and Flonase.   Start Ryvent 1 tablet twice a day for the drainage. This replaces the other antihistamines.   May use azelastine nasal spray 1-2 sprays per nostril twice a day as needed for drainage.   Continue environmental control measures.  Continue Flonase 1 spray twice a day.  Nasal saline spray (i.e., Simply Saline) or nasal saline lavage (i.e., NeilMed) is recommended as needed and prior to medicated nasal sprays.

## 2019-11-25 NOTE — Assessment & Plan Note (Signed)
Breaks out in hives after eating citrus fruits.  Continue to avoid citrus fruits.  

## 2019-11-25 NOTE — Assessment & Plan Note (Signed)
Past history - Hives with penicillin (over 10 years ago) and Vicodin.  °· Continue to avoid for now. Consider penicillin testing in future.  °

## 2019-11-25 NOTE — Progress Notes (Signed)
Follow Up Note  RE: [name] MRN: [id number] DOB: [birth year] Date of Office Visit: 11/25/2019  Referring provider: Martinique, Betty G, MD Primary care provider: Martinique, Betty G, MD  Chief Complaint: Advice Only (Discuss Cardiology Visit)  History of Present Illness: I had the pleasure of seeing [name] for a follow up visit at the [facility] on 11/25/2019. She is a 53 y.o. female, who is being followed for reactive airway disease, allergic rhinitis conjunctivitis, adverse drug reaction. Today she is here for regular follow up visit. Her previous allergy office visit was on 09/30/2019 with Dr. Maudie Mercury.   Reactive airway disease Currently on Symbicort 80 2 puffs twice a day and not needing to use albuterol.   Patient saw cardiologist and echo - Grade 1 diastolic dysfunction.   Denies any SOB, coughing, wheezing, chest tightness, nocturnal awakenings, ER/urgent care visits or prednisone use since the last visit.  Allergic rhino conjunctivitis Patient still having some drainage. Tried zyrtec and allegra with no benefit in the past.  Using azelastine nasal spray and Flonase twice a day with some benefit.  No worsening symptoms off Singulair.   One episode of itchy skin with rash. Resoled after benadryl. Patient denies an changes in diet, medications, personal care products at that time.   Assessment and Plan: [name] is a 53 y.o. female with: Reactive airway disease Past history - Patient concerned about asthma as she noticed chest tightness, SOB, coughing and wheezing for 20 years with worsening symptoms lately. Using albuterol multiple times a day with some benefit. 2020 spirometry was normal and did not improve after bronchodilator treatment but clinically felt slightly improved. CBC normal. ? Had COVID-19 in January/February. Interim history - cardiac evaluation showed grade 1 diastolic dysfunction. Breathing is better.   Today's spirometry showed mild  restriction.   Daily controller medication(s):continue Symbicort 80 2 puffs twice a day with spacer and rinse mouth afterwards.  Prior to physical activity:May use albuterol rescue inhaler 2 puffs 5 to 15 minutes prior to strenuous physical activities.  Rescue medications:May use albuterol rescue inhaler 2 puffs or nebulizer every 4 to 6 hours as needed for shortness of breath, chest tightness, coughing, and wheezing. Monitor frequency of use.  Follow up with cardiology as scheduled.   If symptoms worsen again will get methacholine challenge, CXR and consider starting PPI for possible reflux/heartburn.  Seasonal allergic rhinitis due to pollen Past history - Rhino conjunctivitis symptoms for the past 20 years during the spring through fall. Tried Claritin-D, Singulair and Flonase with some benefit. 2020 skin testing showed: positive to grass pollen only. Interim history - stopped Singulair with no worsening symptoms. Zyrtec and allegra not effective. Still having rhinorrhea despite using azelastine and Flonase.   Start Ryvent 1 tablet twice a day for the drainage. This replaces the other antihistamines.   May use azelastine nasal spray 1-2 sprays per nostril twice a day as needed for drainage.   Continue environmental control measures.  Continue Flonase 1 spray twice a day.  Nasal saline spray (i.e., Simply Saline) or nasal saline lavage (i.e., NeilMed) is recommended as needed and prior to medicated nasal sprays.  Allergic conjunctivitis of both eyes  See assessment and plan as above allergic rhinitis.  Drug reaction Past history - Hives with penicillin (over 10 years ago) and Vicodin.   Continue to avoid for now. Consider penicillin testing in future.   Adverse food reaction Breaks out in hives after eating citrus fruits.  Continue  to avoid citrus fruits.   Return in about 3 months (around 02/23/2020).  Meds ordered this encounter  Medications  . Carbinoxamine Maleate  (RYVENT) 6 MG TABS    Sig: Take 1 tablet by mouth 2 (two) times daily.    Dispense:  60 tablet    Refill:  3   Diagnostics: Spirometry:  Tracings reviewed. Her effort: Good reproducible efforts. FVC: 1.88L FEV1: 1.65L, 84% predicted FEV1/FVC ratio: 88% Interpretation: Spirometry consistent with possible restrictive disease.  Please see scanned spirometry results for details.  Medication List:  Current Outpatient Medications  Medication Sig Dispense Refill  . Azelastine HCl 0.15 % SOLN Use 1-2 sprays per nostril twice a day as needed for drainage. 30 mL 5  . Blood Glucose Monitoring Suppl (ACCU-CHEK AVIVA PLUS) w/Device KIT 1 Device by Does not apply route daily. 1 kit 0  . budesonide-formoterol (SYMBICORT) 80-4.5 MCG/ACT inhaler Inhale 2 puffs into the lungs 2 (two) times daily. 10.2 g 5  . fluticasone (FLONASE) 50 MCG/ACT nasal spray Place 2 sprays into both nostrils 2 (two) times a day.    Marland Kitchen glucose blood (ACCU-CHEK AVIVA PLUS) test strip 2 times daily 180 each 1  . Lancets (ACCU-CHEK SOFT TOUCH) lancets 2/day 180 each 12  . Magnesium Gluconate (MAG-G) 500 (27 Mg) MG TABS Take by mouth.    . metoprolol tartrate (LOPRESSOR) 50 MG tablet TAKE 1 TABLET BY MOUTH 2 HOURS PRIOR TO CT 1 tablet 0  . PROAIR HFA 108 (90 Base) MCG/ACT inhaler INHALE 2 PUFFS INTO THE LUNGS EVERY 6 HOURS AS NEEDED FOR WHEEZING OR SHORTNESS OF BREATH 18 g 2  . SUMAtriptan (IMITREX) 50 MG tablet Take 50 mg by mouth every 2 (two) hours as needed for migraine. May repeat in 2 hours if headache persists or recurs.    . Carbinoxamine Maleate (RYVENT) 6 MG TABS Take 1 tablet by mouth 2 (two) times daily. 60 tablet 3   No current facility-administered medications for this visit.    Allergies: Allergies  Allergen Reactions  . Citrus Hives  . Penicillins Hives  . Vicodin [Hydrocodone-Acetaminophen] Hives  . Topiramate    I reviewed her past medical history, social history, family history, and environmental history  and no significant changes have been reported from her previous visit.  Review of Systems  Constitutional: Negative for appetite change, chills, fever and unexpected weight change.  HENT: Positive for rhinorrhea and sneezing. Negative for congestion and postnasal drip.   Eyes: Negative for itching.  Respiratory: Negative for cough, chest tightness, shortness of breath and wheezing.   Cardiovascular: Negative for chest pain.  Gastrointestinal: Negative for abdominal pain.  Genitourinary: Negative for difficulty urinating.  Skin: Negative for rash.  Allergic/Immunologic: Positive for environmental allergies. Negative for food allergies.  Neurological: Positive for headaches.   Objective: BP 128/82 (BP Location: Right Arm, Patient Position: Sitting, Cuff Size: Normal)   Pulse 72   Temp (!) 97.4 F (36.3 C) (Temporal)   SpO2 96%  There is no height or weight on file to calculate BMI. Physical Exam  Constitutional: She is oriented to person, place, and time. She appears well-developed and well-nourished.  HENT:  Head: Normocephalic and atraumatic.  Right Ear: External ear normal.  Left Ear: External ear normal.  Nose: Nose normal.  Mouth/Throat: Oropharynx is clear and moist.  Eyes: Conjunctivae and EOM are normal.  Neck: Neck supple.  Cardiovascular: Normal rate, regular rhythm and normal heart sounds. Exam reveals no gallop and no friction rub.  No  murmur heard. Pulmonary/Chest: Effort normal and breath sounds normal. She has no wheezes. She has no rales.  Abdominal: Soft.  Neurological: She is alert and oriented to person, place, and time.  Skin: Skin is warm. No rash noted.  Psychiatric: She has a normal mood and affect. Her behavior is normal.  Nursing note and vitals reviewed.  Previous notes and tests were reviewed. The plan was reviewed with the patient/family, and all questions/concerned were addressed.  It was my pleasure to see [name] today and participate in her  care. Please feel free to contact me with any questions or concerns.  Sincerely,  Rexene Alberts, DO Allergy & Immunology  [facility] office: [phone] [facility] office: [place] office: [phone]

## 2019-11-25 NOTE — Assessment & Plan Note (Signed)
Past history - Patient concerned about asthma as she noticed chest tightness, SOB, coughing and wheezing for 20 years with worsening symptoms lately. Using albuterol multiple times a day with some benefit. 2020 spirometry was normal and did not improve after bronchodilator treatment but clinically felt slightly improved. CBC normal. ? Had COVID-19 in January/February. Interim history - cardiac evaluation showed grade 1 diastolic dysfunction. Breathing is better.   Today's spirometry showed mild restriction.   Daily controller medication(s):continue Symbicort 80 2 puffs twice a day with spacer and rinse mouth afterwards.  Prior to physical activity:May use albuterol rescue inhaler 2 puffs 5 to 15 minutes prior to strenuous physical activities.  Rescue medications:May use albuterol rescue inhaler 2 puffs or nebulizer every 4 to 6 hours as needed for shortness of breath, chest tightness, coughing, and wheezing. Monitor frequency of use.  Follow up with cardiology as scheduled.   If symptoms worsen again will get methacholine challenge, CXR and consider starting PPI for possible reflux/heartburn.

## 2019-11-25 NOTE — Patient Instructions (Addendum)
   Daily controller medication(s):continue Symbicort 80 2 puffs twice a day with spacer and rinse mouth afterwards.  Prior to physical activity:May use albuterol rescue inhaler 2 puffs 5 to 15 minutes prior to strenuous physical activities.  Rescue medications:May use albuterol rescue inhaler 2 puffs or nebulizer every 4 to 6 hours as needed for shortness of breath, chest tightness, coughing, and wheezing. Monitor frequency of use.  Follow up with cardiology as scheduled.  Asthma control goals:  Full participation in all desired activities (may need albuterol before activity) Albuterol use two times or less a week on average (not counting use with activity) Cough interfering with sleep two times or less a month Oral steroids no more than once a year No hospitalizations  Seasonal allergic rhinitis due to pollen 2020 skin testing showed: positive to grass pollen only.  Start Ryvent 1 tablet twice a day for the drainage. This replaces your other antihistamines.   May use azelastine nasal spray 1-2 sprays per nostril twice a day as needed for drainage.   Continue environmental control measures.  Continue Flonase 1 spray twice a day.  Nasal saline spray (i.e., Simply Saline) or nasal saline lavage (i.e., NeilMed) is recommended as needed and prior to medicated nasal sprays.  Drug reaction  Consider penicillin testing in future.   Follow up in 3 months or sooner if needed.

## 2019-11-25 NOTE — Assessment & Plan Note (Signed)
   See assessment and plan as above allergic rhinitis. 

## 2019-12-02 ENCOUNTER — Ambulatory Visit: Payer: BC Managed Care – PPO | Admitting: Allergy

## 2019-12-06 DIAGNOSIS — M25521 Pain in right elbow: Secondary | ICD-10-CM | POA: Diagnosis not present

## 2019-12-13 ENCOUNTER — Telehealth: Payer: Self-pay | Admitting: *Deleted

## 2019-12-13 NOTE — Telephone Encounter (Signed)
Spoke with patient and she thinks she had COVID in December this was the cause of her shortness of breath. She is having to pay $1,500 for her Echo and does not want to proceed with Cardiac CT at this time. She will discuss with Dr Oval Linsey at follow up.

## 2019-12-17 DIAGNOSIS — M7711 Lateral epicondylitis, right elbow: Secondary | ICD-10-CM | POA: Diagnosis not present

## 2020-01-16 DIAGNOSIS — Z20828 Contact with and (suspected) exposure to other viral communicable diseases: Secondary | ICD-10-CM | POA: Diagnosis not present

## 2020-01-17 ENCOUNTER — Ambulatory Visit: Payer: BC Managed Care – PPO | Admitting: Cardiovascular Disease

## 2020-01-20 DIAGNOSIS — Z20828 Contact with and (suspected) exposure to other viral communicable diseases: Secondary | ICD-10-CM | POA: Diagnosis not present

## 2020-01-21 ENCOUNTER — Telehealth: Payer: Self-pay | Admitting: Family Medicine

## 2020-01-21 ENCOUNTER — Telehealth (INDEPENDENT_AMBULATORY_CARE_PROVIDER_SITE_OTHER): Payer: BC Managed Care – PPO | Admitting: Family Medicine

## 2020-01-21 ENCOUNTER — Ambulatory Visit: Payer: Self-pay | Admitting: Cardiovascular Disease

## 2020-01-21 ENCOUNTER — Encounter: Payer: Self-pay | Admitting: Family Medicine

## 2020-01-21 ENCOUNTER — Telehealth: Payer: Self-pay | Admitting: Allergy

## 2020-01-21 VITALS — BP 117/82 | Ht 59.0 in

## 2020-01-21 DIAGNOSIS — U071 COVID-19: Secondary | ICD-10-CM

## 2020-01-21 DIAGNOSIS — E1169 Type 2 diabetes mellitus with other specified complication: Secondary | ICD-10-CM

## 2020-01-21 DIAGNOSIS — E669 Obesity, unspecified: Secondary | ICD-10-CM

## 2020-01-21 DIAGNOSIS — E119 Type 2 diabetes mellitus without complications: Secondary | ICD-10-CM

## 2020-01-21 DIAGNOSIS — R059 Cough, unspecified: Secondary | ICD-10-CM

## 2020-01-21 DIAGNOSIS — J45901 Unspecified asthma with (acute) exacerbation: Secondary | ICD-10-CM

## 2020-01-21 DIAGNOSIS — R05 Cough: Secondary | ICD-10-CM | POA: Diagnosis not present

## 2020-01-21 MED ORDER — BENZONATATE 100 MG PO CAPS: 200.0000 mg | ORAL_CAPSULE | Freq: Two times a day (BID) | ORAL | 0 refills | Status: AC | PRN

## 2020-01-21 MED ORDER — BUDESONIDE-FORMOTEROL FUMARATE 80-4.5 MCG/ACT IN AERO: 2.0000 | INHALATION_SPRAY | Freq: Two times a day (BID) | RESPIRATORY_TRACT | 2 refills | Status: DC

## 2020-01-21 NOTE — Telephone Encounter (Signed)
I called and spoke with pt. Virtual appt scheduled for today at 4:30pm with pcp.

## 2020-01-21 NOTE — Telephone Encounter (Signed)
Sent in refill for Symbicort to [facility].

## 2020-01-21 NOTE — Progress Notes (Signed)
Virtual Visit via Video Note   I connected with [name] on 01/21/20 by a video enabled telemedicine application and verified that I am speaking with the correct person using two identifiers.  Location patient: home Location provider:work office Persons participating in the virtual visit: patient, provider  I discussed the limitations of evaluation and management by telemedicine and the availability of in person appointments. The patient expressed understanding and agreed to proceed.   HPI: [name] is a 54 yo female with hx of DM II,HTN, and asthma who was diagnosed with COVID-19 today. Symptoms started about 8 days ago, she thought it was a sinus infection. Nasal congestion, rhinorrhea, postnasal drainage,and sore throat.  3 days ago she was feeling worse, fever 102.6 F x 1 day. Body aches, anosmia and ageustia , productive cough with thick clear phlegm, wheezing, and earlier today exertional dyspnea. Taste and smell slowly coming back.  Negative for CP, palpitations, or diaphoresis. Nausea, attributed to thick postnasal drainage. No vomiting, abdominal pain, or diarrhea.  She has no dyspnea when she is at rest.  She already requested refill on Symbicort 80-4.5 mcg. She has albuterol at home. Now she is feeling better.  She has been taking Tylenol and Robitussin DM.  DM II: Last seen for f/u on 04/02/19.  BS's are "good." She is eating healthier and has noted some wt loss.  Lab Results  Component Value Date   HGBA1C 8.1 (H) 05/04/2017   Negative for polydipsia,polyuria, or polyphagia.  HTN: She is checking BP periodically, twice daily. BP has been in the low 100s/80s. Currently she is on metoprolol tartrate 50 mg twice daily. Negative for visual changes, gross hematuria, decreased urine output, or edema.  Lab Results  Component Value Date   CREATININE 0.89 06/19/2019   BUN 10 06/19/2019   NA 141 06/19/2019   K 4.2 06/19/2019   CL 101 06/19/2019   CO2 27 06/19/2019    ROS: See pertinent positives and negatives per HPI.  Past Medical History:  Diagnosis Date  . Allergy   . Arthritis   . Atypical chest pain 10/25/2019  . Diabetes mellitus without complication ([place])    6270  . Essential hypertension 10/25/2019  . Family history of anesthesia complication    "Mama did; she has alzheimer's; any anesthesia puts her in the twilight zone"  . Gallstones   . GERD (gastroesophageal reflux disease)   . Migraine    "cluster migraine"  . Sciatic nerve injury    "right side"    Past Surgical History:  Procedure Laterality Date  . ANTERIOR CRUCIATE LIGAMENT REPAIR Right 1996  . CHOLECYSTECTOMY N/A 08/31/2014   Procedure: LAPAROSCOPIC CHOLECYSTECTOMY WITH INTRAOPERATIVE CHOLANGIOGRAM;  Surgeon: Greer Pickerel, MD;  Location: [facility];  Service: General;  Laterality: N/A;  . KNEE ARTHROSCOPY Right 1998   with ACL reconstruction  . LAPAROSCOPIC CHOLECYSTECTOMY  08/31/2014  . SINOSCOPY  1998  . TONSILLECTOMY  ~ 55    Family History  Problem Relation Age of Onset  . Dementia Mother   . Stroke Mother   . Diabetes Mother   . Cancer Father        prostate  . Heart attack Father   . Diabetes Daughter   . Hyperlipidemia Daughter   . Hypertension Daughter   . Heart attack Maternal Grandfather   . Allergic rhinitis Neg Hx   . Angioedema Neg Hx   . Asthma Neg Hx   . Atopy Neg Hx   . Eczema Neg Hx   .  Immunodeficiency Neg Hx   . Urticaria Neg Hx     Social History   Socioeconomic History  . Marital status: Divorced    Spouse name: Not on file  . Number of children: Not on file  . Years of education: Not on file  . Highest education level: Not on file  Occupational History  . Not on file  Tobacco Use  . Smoking status: Never Smoker  . Smokeless tobacco: Never Used  Substance and Sexual Activity  . Alcohol use: No  . Drug use: No  . Sexual activity: Yes  Other Topics Concern  . Not on file  Social History Narrative  . Not on file   Social  Determinants of Health   Financial Resource Strain:   . Difficulty of Paying Living Expenses: Not on file  Food Insecurity:   . Worried About Charity fundraiser in the Last Year: Not on file  . Ran Out of Food in the Last Year: Not on file  Transportation Needs:   . Lack of Transportation (Medical): Not on file  . Lack of Transportation (Non-Medical): Not on file  Physical Activity:   . Days of Exercise per Week: Not on file  . Minutes of Exercise per Session: Not on file  Stress:   . Feeling of Stress : Not on file  Social Connections:   . Frequency of Communication with Friends and Family: Not on file  . Frequency of Social Gatherings with Friends and Family: Not on file  . Attends Religious Services: Not on file  . Active Member of Clubs or Organizations: Not on file  . Attends Archivist Meetings: Not on file  . Marital Status: Not on file  Intimate Partner Violence:   . Fear of Current or Ex-Partner: Not on file  . Emotionally Abused: Not on file  . Physically Abused: Not on file  . Sexually Abused: Not on file    Current Outpatient Medications:  .  Azelastine HCl 0.15 % SOLN, Use 1-2 sprays per nostril twice a day as needed for drainage., Disp: 30 mL, Rfl: 5 .  Blood Glucose Monitoring Suppl (ACCU-CHEK AVIVA PLUS) w/Device KIT, 1 Device by Does not apply route daily., Disp: 1 kit, Rfl: 0 .  Carbinoxamine Maleate (RYVENT) 6 MG TABS, Take 1 tablet by mouth 2 (two) times daily., Disp: 60 tablet, Rfl: 3 .  fluticasone (FLONASE) 50 MCG/ACT nasal spray, Place 2 sprays into both nostrils 2 (two) times a day., Disp: , Rfl:  .  glucose blood (ACCU-CHEK AVIVA PLUS) test strip, 2 times daily, Disp: 180 each, Rfl: 1 .  Lancets (ACCU-CHEK SOFT TOUCH) lancets, 2/day, Disp: 180 each, Rfl: 12 .  Magnesium Gluconate (MAG-G) 500 (27 Mg) MG TABS, Take by mouth., Disp: , Rfl:  .  metoprolol tartrate (LOPRESSOR) 50 MG tablet, TAKE 1 TABLET BY MOUTH 2 HOURS PRIOR TO CT, Disp: 1  tablet, Rfl: 0 .  PROAIR HFA 108 (90 Base) MCG/ACT inhaler, INHALE 2 PUFFS INTO THE LUNGS EVERY 6 HOURS AS NEEDED FOR WHEEZING OR SHORTNESS OF BREATH, Disp: 18 g, Rfl: 2 .  SUMAtriptan (IMITREX) 50 MG tablet, Take 50 mg by mouth every 2 (two) hours as needed for migraine. May repeat in 2 hours if headache persists or recurs., Disp: , Rfl:  .  benzonatate (TESSALON) 100 MG capsule, Take 2 capsules (200 mg total) by mouth 2 (two) times daily as needed for up to 10 days., Disp: 40 capsule, Rfl: 0 .  budesonide-formoterol (SYMBICORT) 80-4.5 MCG/ACT inhaler, Inhale 2 puffs into the lungs 2 (two) times daily., Disp: 10.2 g, Rfl: 2  EXAM:  VITALS per patient if applicable:BP 802/23   Ht '4\' 11"'  (1.499 m)   BMI 38.05 kg/m   GENERAL: alert, oriented, appears well and in no acute distress  HEENT: atraumatic, conjunctiva clear, no obvious abnormalities on inspection.  NECK: normal movements of the head and neck  LUNGS: on inspection no signs of respiratory distress, breathing rate appears normal, no obvious gross SOB, gasping or wheezing.  CV: no obvious cyanosis  Holly: moves all visible extremities without noticeable abnormality  PSYCH/NEURO: pleasant and cooperative, no obvious depression or anxiety, speech and thought processing grossly intact  ASSESSMENT AND PLAN:  Discussed the following assessment and plan:  COVID-19 virus infection - Plan: Temperature monitoring Educated about diagnosis, prognosis, and treatment recommendations. She lives with her daughter-in-law, who already recovered from COVID-30. Quarantine. Rest and adequate hydration. Recommend checking temperature before taking Tylenol. Clearly instructed about warning signs.  Cough - Plan: benzonatate (TESSALON) 100 MG capsule OTC plain Mucinex may help. Explained that cough can last a few weeks after recovery.  Type 2 diabetes mellitus without complication, without long-term current use of insulin (HCC) Based on BS,  problem seems to be well controlled. Currently she is on nonpharmacologic treatment.  Reactive airway disease with acute exacerbation, unspecified asthma severity, unspecified whether persistent Albuterol inh 2 puff every 6 hours for a week then as needed for wheezing or shortness of breath.  Symbicort 80-4.5 mcg 2 puffs twice daily. She has a spacer. Clearly instructed about warning signs.  She voices understanding and agrees with plan.   I discussed the assessment and treatment plan with the patient. Holly Ion was provided an opportunity to ask questions and all were answered. She agreed with the plan and demonstrated an understanding of the instructions.    Return in about 2 days (around 01/23/2020).    Florice Hindle Martinique, MD

## 2020-01-21 NOTE — Telephone Encounter (Signed)
Pt is requesting to speak to a nurse. Pt tested positive for Covid-19 today. She has a cough and is shortness of breath. Per pt, she has been taking cough medicine and would like to know what else she can do. Thanks

## 2020-01-21 NOTE — Telephone Encounter (Signed)
Pt called and needs to have symbicort [facility] on [address]. [phone].

## 2020-01-24 ENCOUNTER — Telehealth (INDEPENDENT_AMBULATORY_CARE_PROVIDER_SITE_OTHER): Payer: Self-pay | Admitting: Family Medicine

## 2020-01-24 ENCOUNTER — Encounter: Payer: Self-pay | Admitting: Family Medicine

## 2020-01-24 VITALS — HR 76

## 2020-01-24 DIAGNOSIS — R0600 Dyspnea, unspecified: Secondary | ICD-10-CM

## 2020-01-24 DIAGNOSIS — R059 Cough, unspecified: Secondary | ICD-10-CM

## 2020-01-24 DIAGNOSIS — R05 Cough: Secondary | ICD-10-CM

## 2020-01-24 DIAGNOSIS — U071 COVID-19: Secondary | ICD-10-CM

## 2020-01-24 DIAGNOSIS — J45901 Unspecified asthma with (acute) exacerbation: Secondary | ICD-10-CM

## 2020-01-24 MED ORDER — GUAIFENESIN-CODEINE 100-10 MG/5ML PO SOLN: 5.0000 mL | Freq: Every evening | ORAL | 0 refills | Status: DC | PRN

## 2020-01-24 NOTE — Progress Notes (Signed)
Virtual Visit via Video Note   I connected with [name] on 01/24/20 by a video enabled telemedicine application and verified that I am speaking with the correct person using two identifiers.  Location patient: home Location provider:work office Persons participating in the virtual visit: patient, provider  I discussed the limitations of evaluation and management by telemedicine and the availability of in person appointments. The patient expressed understanding and agreed to proceed.   HPI: [name] is a 54 yo female with hx of asthma recently dx'ed with COVID 19 infection. Symptoms started about 11 days.  She is no longer having fever, chills, nausea, diarrhea, anosmia, or ageustia.  Still having coughing spells that caused chest wall pain and exertional dyspnea. Symptoms exacerbated by activities like going up stairs.  Sometimes she is short of breath while she is sitting and when in bed. She has not noted stridor,wheezing, or lower extremity edema.  Negative for palpitation, diaphoresis, abdominal pain, vomiting,urinary symptoms, or skin rash. Symptoms are not getting worse.  Cough keeps her from sleep. She is using benzonatate 100-200 mg tid as needed.  She feels like inhalers are not helping a lot. She is on Symbicort 80-4.5 mcg bid and Albuterol inh.  She has a pulse Ox at home and O2 level , pulse ox is "Ok", low 90's.  ROS: See pertinent positives and negatives per HPI.  Past Medical History:  Diagnosis Date  . Allergy   . Arthritis   . Atypical chest pain 10/25/2019  . Diabetes mellitus without complication ([place])    1610  . Essential hypertension 10/25/2019  . Family history of anesthesia complication    "Mama did; she has alzheimer's; any anesthesia puts her in the twilight zone"  . Gallstones   . GERD (gastroesophageal reflux disease)   . Migraine    "cluster migraine"  . Sciatic nerve injury    "right side"   Past Surgical History:  Procedure Laterality  Date  . ANTERIOR CRUCIATE LIGAMENT REPAIR Right 1996  . CHOLECYSTECTOMY N/A 08/31/2014   Procedure: LAPAROSCOPIC CHOLECYSTECTOMY WITH INTRAOPERATIVE CHOLANGIOGRAM;  Surgeon: Greer Pickerel, MD;  Location: [facility];  Service: General;  Laterality: N/A;  . KNEE ARTHROSCOPY Right 1998   with ACL reconstruction  . LAPAROSCOPIC CHOLECYSTECTOMY  08/31/2014  . SINOSCOPY  1998  . TONSILLECTOMY  ~ 70    Family History  Problem Relation Age of Onset  . Dementia Mother   . Stroke Mother   . Diabetes Mother   . Cancer Father        prostate  . Heart attack Father   . Diabetes Daughter   . Hyperlipidemia Daughter   . Hypertension Daughter   . Heart attack Maternal Grandfather   . Allergic rhinitis Neg Hx   . Angioedema Neg Hx   . Asthma Neg Hx   . Atopy Neg Hx   . Eczema Neg Hx   . Immunodeficiency Neg Hx   . Urticaria Neg Hx     Social History   Socioeconomic History  . Marital status: Divorced    Spouse name: Not on file  . Number of children: Not on file  . Years of education: Not on file  . Highest education level: Not on file  Occupational History  . Not on file  Tobacco Use  . Smoking status: Never Smoker  . Smokeless tobacco: Never Used  Substance and Sexual Activity  . Alcohol use: No  . Drug use: No  . Sexual activity: Yes  Other Topics  Concern  . Not on file  Social History Narrative  . Not on file   Social Determinants of Health   Financial Resource Strain:   . Difficulty of Paying Living Expenses: Not on file  Food Insecurity:   . Worried About Charity fundraiser in the Last Year: Not on file  . Ran Out of Food in the Last Year: Not on file  Transportation Needs:   . Lack of Transportation (Medical): Not on file  . Lack of Transportation (Non-Medical): Not on file  Physical Activity:   . Days of Exercise per Week: Not on file  . Minutes of Exercise per Session: Not on file  Stress:   . Feeling of Stress : Not on file  Social Connections:   . Frequency of  Communication with Friends and Family: Not on file  . Frequency of Social Gatherings with Friends and Family: Not on file  . Attends Religious Services: Not on file  . Active Member of Clubs or Organizations: Not on file  . Attends Archivist Meetings: Not on file  . Marital Status: Not on file  Intimate Partner Violence:   . Fear of Current or Ex-Partner: Not on file  . Emotionally Abused: Not on file  . Physically Abused: Not on file  . Sexually Abused: Not on file    Current Outpatient Medications:  .  Azelastine HCl 0.15 % SOLN, Use 1-2 sprays per nostril twice a day as needed for drainage., Disp: 30 mL, Rfl: 5 .  benzonatate (TESSALON) 100 MG capsule, Take 2 capsules (200 mg total) by mouth 2 (two) times daily as needed for up to 10 days., Disp: 40 capsule, Rfl: 0 .  Blood Glucose Monitoring Suppl (ACCU-CHEK AVIVA PLUS) w/Device KIT, 1 Device by Does not apply route daily., Disp: 1 kit, Rfl: 0 .  budesonide-formoterol (SYMBICORT) 80-4.5 MCG/ACT inhaler, Inhale 2 puffs into the lungs 2 (two) times daily., Disp: 10.2 g, Rfl: 2 .  Carbinoxamine Maleate (RYVENT) 6 MG TABS, Take 1 tablet by mouth 2 (two) times daily., Disp: 60 tablet, Rfl: 3 .  fluticasone (FLONASE) 50 MCG/ACT nasal spray, Place 2 sprays into both nostrils 2 (two) times a day., Disp: , Rfl:  .  glucose blood (ACCU-CHEK AVIVA PLUS) test strip, 2 times daily, Disp: 180 each, Rfl: 1 .  Lancets (ACCU-CHEK SOFT TOUCH) lancets, 2/day, Disp: 180 each, Rfl: 12 .  Magnesium Gluconate (MAG-G) 500 (27 Mg) MG TABS, Take by mouth., Disp: , Rfl:  .  metoprolol tartrate (LOPRESSOR) 50 MG tablet, TAKE 1 TABLET BY MOUTH 2 HOURS PRIOR TO CT, Disp: 1 tablet, Rfl: 0 .  PROAIR HFA 108 (90 Base) MCG/ACT inhaler, INHALE 2 PUFFS INTO THE LUNGS EVERY 6 HOURS AS NEEDED FOR WHEEZING OR SHORTNESS OF BREATH, Disp: 18 g, Rfl: 2 .  SUMAtriptan (IMITREX) 50 MG tablet, Take 50 mg by mouth every 2 (two) hours as needed for migraine. May repeat in 2  hours if headache persists or recurs., Disp: , Rfl:  .  guaiFENesin-codeine 100-10 MG/5ML syrup, Take 5 mLs by mouth at bedtime as needed for up to 10 days for cough., Disp: 50 mL, Rfl: 0  EXAM:  VITALS per patient if applicable:Pulse 76   SpO2 93%   GENERAL: alert, oriented, appears well and in no acute distress  HEENT: atraumatic, conjunctiva clear, no obvious abnormalities on inspection.  NECK: normal movements of the head and neck  LUNGS: on inspection no signs of respiratory distress, breathing rate  appears normal, no obvious gross SOB, gasping or wheezing. Coughs a few times during visit.  CV: no obvious cyanosis  Holly: moves all visible extremities without noticeable abnormality  PSYCH/NEURO: pleasant and cooperative, no obvious depression, + anxious. Speech and thought processing grossly intact  ASSESSMENT AND PLAN:  Discussed the following assessment and plan:  Dyspnea, unspecified type Observation during interview does not suggest respiratory distress. Reporting symptoms as stable and pulse Ox in the 90's. For now I think she can continue monitoring for sudden changes and seek immediate medical attention if needed.  COVID-19 virus infection Educated about symptoms,prognosis,and possible complications. For now I think she can continue monitoring symptoms at home.  Cough - Plan: guaiFENesin-codeine 100-10 MG/5ML syrup Cough can last a few more weeks. Allergy to hydrocodone, hives. She agrees with trying:. We discussed some side effects.  Reactive airway disease with acute exacerbation, unspecified asthma severity, unspecified whether persistent For now continue Symbicort 80-4.5 mcg bid. Albuterol inh 2 puff q 4-6 hours prn. Clearly instructed about warning signs.    I discussed the assessment and treatment plan with the patient. [name] was provided an opportunity to ask questions and all were answered. She agreed with the plan and demonstrated an understanding of  the instructions.    Return in about 5 days (around 01/29/2020).    Pearse Shiffler [place], MD

## 2020-01-29 ENCOUNTER — Encounter: Payer: Self-pay | Admitting: Family Medicine

## 2020-01-29 ENCOUNTER — Telehealth (INDEPENDENT_AMBULATORY_CARE_PROVIDER_SITE_OTHER): Payer: Self-pay | Admitting: Family Medicine

## 2020-01-29 VITALS — Ht 59.0 in

## 2020-01-29 DIAGNOSIS — R0789 Other chest pain: Secondary | ICD-10-CM

## 2020-01-29 DIAGNOSIS — U071 COVID-19: Secondary | ICD-10-CM

## 2020-01-29 DIAGNOSIS — J45909 Unspecified asthma, uncomplicated: Secondary | ICD-10-CM

## 2020-01-29 DIAGNOSIS — R0600 Dyspnea, unspecified: Secondary | ICD-10-CM

## 2020-01-29 MED ORDER — BUDESONIDE-FORMOTEROL FUMARATE 160-4.5 MCG/ACT IN AERO: 2.0000 | INHALATION_SPRAY | Freq: Two times a day (BID) | RESPIRATORY_TRACT | 3 refills | Status: DC

## 2020-01-29 MED ORDER — ONE FLOW SPIROMETER DEVI: 1.0000 | Freq: Three times a day (TID) | 0 refills | Status: AC | PRN

## 2020-01-29 NOTE — Progress Notes (Signed)
Virtual Visit via Video Note   I connected with [name] on 01/29/20 by a video enabled telemedicine application and verified that I am speaking with the correct person using two identifiers.  Location patient: home Location provider:work office Persons participating in the virtual visit: patient, provider  I discussed the limitations of evaluation and management by telemedicine and the availability of in person appointments. The patient expressed understanding and agreed to proceed.   HPI: [name] is a 54 yo female with hx of asthma and HTN who is following on COVID 19 infection. Dx'ed on 01/21/20, she has been symptomatic since 01/13/20.  She is still having SOB, not longer at rest, and in general it has improved.  She has not had fever,chills,body aches, sore throat, abdominal pain,N/V,or skin rash. Still having chest wall sore/burning pain with coughing spells and intermittent wheezing. Pain is not radiated, no associated diaphoresis or palpitations. Negative for heartburn.  Codeine caused skin itching,no rash. She is on Symbicort 80-4.5 mcg 2 puff bid and Albuterol inh q 6 hours.  Anosmia and ageustia resolved.  She is very anxious about going back to work because some employers do not wear masks, afraid of getting re-infected. According to pt,she can perform all his job functions working from home.   ROS: See pertinent positives and negatives per HPI.  Past Medical History:  Diagnosis Date  . Allergy   . Arthritis   . Atypical chest pain 10/25/2019  . Diabetes mellitus without complication ([place])    2409  . Essential hypertension 10/25/2019  . Family history of anesthesia complication    "Mama did; she has alzheimer's; any anesthesia puts her in the twilight zone"  . Gallstones   . GERD (gastroesophageal reflux disease)   . Migraine    "cluster migraine"  . Sciatic nerve injury    "right side"    Past Surgical History:  Procedure Laterality Date  . ANTERIOR CRUCIATE  LIGAMENT REPAIR Right 1996  . CHOLECYSTECTOMY N/A 08/31/2014   Procedure: LAPAROSCOPIC CHOLECYSTECTOMY WITH INTRAOPERATIVE CHOLANGIOGRAM;  Surgeon: Greer Pickerel, MD;  Location: [facility];  Service: General;  Laterality: N/A;  . KNEE ARTHROSCOPY Right 1998   with ACL reconstruction  . LAPAROSCOPIC CHOLECYSTECTOMY  08/31/2014  . SINOSCOPY  1998  . TONSILLECTOMY  ~ 12    Family History  Problem Relation Age of Onset  . Dementia Mother   . Stroke Mother   . Diabetes Mother   . Cancer Father        prostate  . Heart attack Father   . Diabetes Daughter   . Hyperlipidemia Daughter   . Hypertension Daughter   . Heart attack Maternal Grandfather   . Allergic rhinitis Neg Hx   . Angioedema Neg Hx   . Asthma Neg Hx   . Atopy Neg Hx   . Eczema Neg Hx   . Immunodeficiency Neg Hx   . Urticaria Neg Hx     Social History   Socioeconomic History  . Marital status: Divorced    Spouse name: Not on file  . Number of children: Not on file  . Years of education: Not on file  . Highest education level: Not on file  Occupational History  . Not on file  Tobacco Use  . Smoking status: Never Smoker  . Smokeless tobacco: Never Used  Substance and Sexual Activity  . Alcohol use: No  . Drug use: No  . Sexual activity: Yes  Other Topics Concern  . Not on file  Social History Narrative  . Not on file   Social Determinants of Health   Financial Resource Strain:   . Difficulty of Paying Living Expenses: Not on file  Food Insecurity:   . Worried About Charity fundraiser in the Last Year: Not on file  . Ran Out of Food in the Last Year: Not on file  Transportation Needs:   . Lack of Transportation (Medical): Not on file  . Lack of Transportation (Non-Medical): Not on file  Physical Activity:   . Days of Exercise per Week: Not on file  . Minutes of Exercise per Session: Not on file  Stress:   . Feeling of Stress : Not on file  Social Connections:   . Frequency of Communication with Friends  and Family: Not on file  . Frequency of Social Gatherings with Friends and Family: Not on file  . Attends Religious Services: Not on file  . Active Member of Clubs or Organizations: Not on file  . Attends Archivist Meetings: Not on file  . Marital Status: Not on file  Intimate Partner Violence:   . Fear of Current or Ex-Partner: Not on file  . Emotionally Abused: Not on file  . Physically Abused: Not on file  . Sexually Abused: Not on file      Current Outpatient Medications:  .  Azelastine HCl 0.15 % SOLN, Use 1-2 sprays per nostril twice a day as needed for drainage., Disp: 30 mL, Rfl: 5 .  benzonatate (TESSALON) 100 MG capsule, Take 2 capsules (200 mg total) by mouth 2 (two) times daily as needed for up to 10 days., Disp: 40 capsule, Rfl: 0 .  Blood Glucose Monitoring Suppl (ACCU-CHEK AVIVA PLUS) w/Device KIT, 1 Device by Does not apply route daily., Disp: 1 kit, Rfl: 0 .  Carbinoxamine Maleate (RYVENT) 6 MG TABS, Take 1 tablet by mouth 2 (two) times daily., Disp: 60 tablet, Rfl: 3 .  fluticasone (FLONASE) 50 MCG/ACT nasal spray, Place 2 sprays into both nostrils 2 (two) times a day., Disp: , Rfl:  .  glucose blood (ACCU-CHEK AVIVA PLUS) test strip, 2 times daily, Disp: 180 each, Rfl: 1 .  Lancets (ACCU-CHEK SOFT TOUCH) lancets, 2/day, Disp: 180 each, Rfl: 12 .  Magnesium Gluconate (MAG-G) 500 (27 Mg) MG TABS, Take by mouth., Disp: , Rfl:  .  metoprolol tartrate (LOPRESSOR) 50 MG tablet, TAKE 1 TABLET BY MOUTH 2 HOURS PRIOR TO CT, Disp: 1 tablet, Rfl: 0 .  PROAIR HFA 108 (90 Base) MCG/ACT inhaler, INHALE 2 PUFFS INTO THE LUNGS EVERY 6 HOURS AS NEEDED FOR WHEEZING OR SHORTNESS OF BREATH, Disp: 18 g, Rfl: 2 .  SUMAtriptan (IMITREX) 50 MG tablet, Take 50 mg by mouth every 2 (two) hours as needed for migraine. May repeat in 2 hours if headache persists or recurs., Disp: , Rfl:  .  budesonide-formoterol (SYMBICORT) 160-4.5 MCG/ACT inhaler, Inhale 2 puffs into the lungs 2 (two)  times daily., Disp: 1 Inhaler, Rfl: 3 .  Respiratory Therapy Supplies (ONE FLOW SPIROMETER) DEVI, 1 Device by Does not apply route 3 (three) times daily as needed., Disp: 1 each, Rfl: 0  EXAM:  VITALS per patient if applicable:Ht '4\' 11"'  (1.499 m)   SpO2 94%   BMI 38.05 kg/m   GENERAL: alert, oriented, appears well and in no acute distress  HEENT: atraumatic, conjunctiva clear, no obvious abnormalities on inspection of external nose and ears  NECK: normal movements of the head and neck  LUNGS: on  inspection no signs of respiratory distress, breathing rate appears normal, no obvious gross SOB, gasping,cough, or wheezing  CV: no obvious cyanosis  Holly: moves all visible extremities without noticeable abnormality  PSYCH/NEURO: pleasant and cooperative, no obvious depression,+ anxious. Speech and thought processing grossly intact  ASSESSMENT AND PLAN:  Discussed the following assessment and plan:  Chest wall pain Hx suggest musculoskeletal pain. I do not think she needs to go to the ER at this time. Avoid shallow breathing.  COVID-19 virus infection - Plan: Respiratory Therapy Supplies (ONE FLOW SPIROMETER) DEVI Acute symptoms have improved. Incentive spirometry recommended.  Dyspnea, unspecified type - Plan: Respiratory Therapy Supplies (ONE FLOW SPIROMETER) DEVI Vs deconditioning. Improving. I do not think imaging is needed today but if worse + new associated symptom she needs to go to the ER. Clearly instructed about warning signs.  Mild reactive airways disease, unspecified whether persistent - Plan: Respiratory Therapy Supplies (ONE FLOW SPIROMETER) DEVI, budesonide-formoterol (SYMBICORT) 160-4.5 MCG/ACT inhaler Not well controlled. Symbicort increased from 80 to 160-4.5 mg. Side effects discussed. Clearly instructed about warning signs.   I discussed the assessment and treatment plan with the patient. [name] was provided an opportunity to ask questions and all were  answered. She agreed with the plan and demonstrated an understanding of the instructions.     Return in about 4 weeks (around 02/26/2020) for Before if needed..    Demonta Wombles [place], MD

## 2020-01-30 ENCOUNTER — Encounter: Payer: Self-pay | Admitting: Family Medicine

## 2020-01-31 NOTE — Progress Notes (Signed)
Patient is scheduled for 03/06/2020 at 10 AM

## 2020-02-07 ENCOUNTER — Encounter: Payer: Self-pay | Admitting: Family Medicine

## 2020-02-10 ENCOUNTER — Encounter: Payer: Self-pay | Admitting: Family Medicine

## 2020-02-11 NOTE — Telephone Encounter (Signed)
The patient called in wanting to talk with Dr. [place] or her nurse about her having another asthma attack yesterday and she sent a MyChart message but still hasn't hear anything.  Please advise

## 2020-02-11 NOTE — Telephone Encounter (Signed)
I spoke with pt. Appt made for tomorrow at 2:30 pm since we do not have x-ray today.

## 2020-02-12 ENCOUNTER — Other Ambulatory Visit: Payer: Self-pay

## 2020-02-12 ENCOUNTER — Ambulatory Visit (INDEPENDENT_AMBULATORY_CARE_PROVIDER_SITE_OTHER): Payer: BC Managed Care – PPO | Admitting: Family Medicine

## 2020-02-12 ENCOUNTER — Encounter: Payer: Self-pay | Admitting: Family Medicine

## 2020-02-12 ENCOUNTER — Ambulatory Visit (INDEPENDENT_AMBULATORY_CARE_PROVIDER_SITE_OTHER): Payer: BC Managed Care – PPO

## 2020-02-12 VITALS — BP 128/80 | HR 88 | Temp 95.7°F | Resp 12 | Ht 59.0 in | Wt 183.0 lb

## 2020-02-12 DIAGNOSIS — Z5181 Encounter for therapeutic drug level monitoring: Secondary | ICD-10-CM

## 2020-02-12 DIAGNOSIS — R0789 Other chest pain: Secondary | ICD-10-CM | POA: Diagnosis not present

## 2020-02-12 DIAGNOSIS — J45909 Unspecified asthma, uncomplicated: Secondary | ICD-10-CM | POA: Diagnosis not present

## 2020-02-12 DIAGNOSIS — H6061 Unspecified chronic otitis externa, right ear: Secondary | ICD-10-CM

## 2020-02-12 DIAGNOSIS — R0600 Dyspnea, unspecified: Secondary | ICD-10-CM

## 2020-02-12 DIAGNOSIS — I10 Essential (primary) hypertension: Secondary | ICD-10-CM

## 2020-02-12 DIAGNOSIS — R05 Cough: Secondary | ICD-10-CM | POA: Diagnosis not present

## 2020-02-12 LAB — CBC WITH DIFFERENTIAL/PLATELET
Basophils Absolute: 0.1 10*3/uL (ref 0.0–0.1)
Basophils Relative: 0.7 % (ref 0.0–3.0)
Eosinophils Absolute: 0.3 10*3/uL (ref 0.0–0.7)
Eosinophils Relative: 3.4 % (ref 0.0–5.0)
HCT: 41.3 % (ref 36.0–46.0)
Hemoglobin: 13.6 g/dL (ref 12.0–15.0)
Lymphocytes Relative: 40.5 % (ref 12.0–46.0)
Lymphs Abs: 3.1 10*3/uL (ref 0.7–4.0)
MCHC: 32.9 g/dL (ref 30.0–36.0)
MCV: 90 fl (ref 78.0–100.0)
Monocytes Absolute: 0.7 10*3/uL (ref 0.1–1.0)
Monocytes Relative: 9.8 % (ref 3.0–12.0)
Neutro Abs: 3.5 10*3/uL (ref 1.4–7.7)
Neutrophils Relative %: 45.6 % (ref 43.0–77.0)
Platelets: 208 10*3/uL (ref 150.0–400.0)
RBC: 4.58 Mil/uL (ref 3.87–5.11)
RDW: 13.9 % (ref 11.5–15.5)
WBC: 7.6 10*3/uL (ref 4.0–10.5)

## 2020-02-12 LAB — BASIC METABOLIC PANEL
BUN: 11 mg/dL (ref 6–23)
CO2: 30 mEq/L (ref 19–32)
Calcium: 9.9 mg/dL (ref 8.4–10.5)
Chloride: 102 mEq/L (ref 96–112)
Creatinine, Ser: 0.82 mg/dL (ref 0.40–1.20)
GFR: 72.39 mL/min (ref >60.00)
Glucose, Bld: 109 mg/dL — ABNORMAL HIGH (ref 70–99)
Potassium: 3.9 mEq/L (ref 3.5–5.1)
Sodium: 140 mEq/L (ref 135–145)

## 2020-02-12 MED ORDER — MONTELUKAST SODIUM 10 MG PO TABS: 10.0000 mg | ORAL_TABLET | Freq: Every day | ORAL | 3 refills | Status: DC

## 2020-02-12 NOTE — Patient Instructions (Signed)
A few things to remember from today's visit:   Encounter for medication monitoring - Plan: VITAMIN D 25 Hydroxy (Vit-D Deficiency, Fractures)  Dyspnea, unspecified type - Plan: Basic metabolic panel, CBC with Differential/Platelet, DG Chest 2 View  Chest wall pain  Mild reactive airways disease, unspecified whether persistent - Plan: DG Chest 2 View, montelukast (SINGULAIR) 10 MG tablet  Today examination did not reveal and serious process. Symptoms could be sequela of covid 19.  Singulair 10 mg added today. No changes in rest of meds.  Please be sure medication list is accurate. If a new problem present, please set up appointment sooner than planned today.

## 2020-02-12 NOTE — Progress Notes (Addendum)
HPI:  [name] is a 54 y.o. female, who is here today to follow on recent OV. She was last seen on2/3/21. Diagnosed with COVID-19 on 01/21/2020, screening after 3 days of fever.  Most symptoms have resolved but she is still having episodes of cough and wheezing. Symptoms exacerbated by exertion.  Dyspnea has improved but still she does not feel like she is breathing "normal."  She has not had fever. Occasional chills. + "Brain fog"feeling.  She has history of allergy rhinitis and asthma. Copious post nasal drainage, worse in the morning, she brings phlem up to clear her throat. Negative for hemoptysis.  She is on Zyrtec 10 mg daily. Flonase nasal spray bid. Symbicort 160-4.5 mcg bid,recently dose was increased. Albuterol in 2 puff qid since her last visit.  Immunology appt on 02/25/20.  L>R ear discomfort and pruritus intermittent since last year. No Hx of trauma and no drainage. No hearing changes.  She also would like to have vitamin D checked. She is on Vit D 20,000 U daily, she decided to take it because a friend recommended it. No know Hx of vit D deficiency.  Also concerned about upper ,bilateral chest discomfort,burning like sensation and thoracic back pain when she has cough spells. No heartburn.  HTN: She is also concerned about elevated BPs at home, 140s/80s-90s. Negative for unusual headache, visual changes, palpitation, gross hematuria, decreased urine output, foamy urine, or edema. She is on Metoprolol Tartrate 50 mg bid.  Lab Results  Component Value Date   CREATININE 0.89 06/19/2019   BUN 10 06/19/2019   NA 141 06/19/2019   K 4.2 06/19/2019   CL 101 06/19/2019   CO2 27 06/19/2019   Review of Systems  Constitutional: Positive for fatigue. Negative for activity change and appetite change.  HENT: Negative for mouth sores, nosebleeds, sinus pain and sore throat.   Respiratory: Negative for stridor.   Cardiovascular: Negative for chest  pain, palpitations and leg swelling.  Gastrointestinal: Negative for abdominal pain, nausea and vomiting.       Negative for changes in bowel habits.  Genitourinary: Negative for decreased urine volume, dysuria and hematuria.  Musculoskeletal: Negative for gait problem and myalgias.  Skin: Negative for pallor and rash.  Allergic/Immunologic: Positive for environmental allergies.  Neurological: Negative for syncope, facial asymmetry and numbness.  Psychiatric/Behavioral: Negative for confusion. The patient is nervous/anxious.   Rest see pertinent positives and negatives per HPI.   Current Outpatient Medications on File Prior to Visit  Medication Sig Dispense Refill  . Azelastine HCl 0.15 % SOLN Use 1-2 sprays per nostril twice a day as needed for drainage. 30 mL 5  . Blood Glucose Monitoring Suppl (ACCU-CHEK AVIVA PLUS) w/Device KIT 1 Device by Does not apply route daily. 1 kit 0  . budesonide-formoterol (SYMBICORT) 160-4.5 MCG/ACT inhaler Inhale 2 puffs into the lungs 2 (two) times daily. 1 Inhaler 3  . Carbinoxamine Maleate (RYVENT) 6 MG TABS Take 1 tablet by mouth 2 (two) times daily. 60 tablet 3  . fluticasone (FLONASE) 50 MCG/ACT nasal spray Place 2 sprays into both nostrils 2 (two) times a day.    Marland Kitchen glucose blood (ACCU-CHEK AVIVA PLUS) test strip 2 times daily 180 each 1  . Lancets (ACCU-CHEK SOFT TOUCH) lancets 2/day 180 each 12  . Magnesium Gluconate (MAG-G) 500 (27 Mg) MG TABS Take by mouth.    . metoprolol tartrate (LOPRESSOR) 50 MG tablet TAKE 1 TABLET BY MOUTH 2 HOURS PRIOR TO CT  1 tablet 0  . PROAIR HFA 108 (90 Base) MCG/ACT inhaler INHALE 2 PUFFS INTO THE LUNGS EVERY 6 HOURS AS NEEDED FOR WHEEZING OR SHORTNESS OF BREATH 18 g 2  . Respiratory Therapy Supplies (ONE FLOW SPIROMETER) DEVI 1 Device by Does not apply route 3 (three) times daily as needed. 1 each 0  . SUMAtriptan (IMITREX) 50 MG tablet Take 50 mg by mouth every 2 (two) hours as needed for migraine. May repeat in 2  hours if headache persists or recurs.     No current facility-administered medications on file prior to visit.     Past Medical History:  Diagnosis Date  . Allergy   . Arthritis   . Atypical chest pain 10/25/2019  . Diabetes mellitus without complication ([place])    1245  . Essential hypertension 10/25/2019  . Family history of anesthesia complication    "Mama did; she has alzheimer's; any anesthesia puts her in the twilight zone"  . Gallstones   . GERD (gastroesophageal reflux disease)   . Migraine    "cluster migraine"  . Sciatic nerve injury    "right side"   Allergies  Allergen Reactions  . Citrus Hives  . Penicillins Hives  . Vicodin [Hydrocodone-Acetaminophen] Hives  . Topiramate     Social History   Socioeconomic History  . Marital status: Divorced    Spouse name: Not on file  . Number of children: Not on file  . Years of education: Not on file  . Highest education level: Not on file  Occupational History  . Not on file  Tobacco Use  . Smoking status: Never Smoker  . Smokeless tobacco: Never Used  Substance and Sexual Activity  . Alcohol use: No  . Drug use: No  . Sexual activity: Yes  Other Topics Concern  . Not on file  Social History Narrative  . Not on file   Social Determinants of Health   Financial Resource Strain:   . Difficulty of Paying Living Expenses: Not on file  Food Insecurity:   . Worried About Charity fundraiser in the Last Year: Not on file  . Ran Out of Food in the Last Year: Not on file  Transportation Needs:   . Lack of Transportation (Medical): Not on file  . Lack of Transportation (Non-Medical): Not on file  Physical Activity:   . Days of Exercise per Week: Not on file  . Minutes of Exercise per Session: Not on file  Stress:   . Feeling of Stress : Not on file  Social Connections:   . Frequency of Communication with Friends and Family: Not on file  . Frequency of Social Gatherings with Friends and Family: Not on file  .  Attends Religious Services: Not on file  . Active Member of Clubs or Organizations: Not on file  . Attends Archivist Meetings: Not on file  . Marital Status: Not on file    Vitals:   02/12/20 1422  BP: 128/80  Pulse: 88  Resp: 12  Temp: (!) 95.7 F (35.4 C)   Body mass index is 36.96 kg/m.   Physical Exam  Nursing note and vitals reviewed. Constitutional: She is oriented to person, place, and time. She appears well-developed. No distress.  HENT:  Head: Normocephalic and atraumatic.  Right Ear: Tympanic membrane normal. No tenderness. Tympanic membrane is not bulging.  Left Ear: Tympanic membrane normal. No tenderness. Tympanic membrane is not bulging.  Mouth/Throat: Oropharynx is clear and moist and mucous membranes  are normal.  No cerumen bilateral. Mildly scaly and minimal erythema right ear canal.  Eyes: Pupils are equal, round, and reactive to light. Conjunctivae are normal.  Cardiovascular: Normal rate and regular rhythm.  No murmur heard. Pulses:      Dorsalis pedis pulses are 2+ on the right side and 2+ on the left side.  Respiratory: Effort normal and breath sounds normal. No accessory muscle usage. No respiratory distress. She has no wheezes. She has no rhonchi. She has no rales. She exhibits no tenderness.  Non productive cough a couple times during visit. HR 12/min.  GI: Soft. She exhibits no mass. There is no hepatomegaly. There is no abdominal tenderness.  Musculoskeletal:        General: No edema.  Lymphadenopathy:    She has no cervical adenopathy.  Neurological: She is alert and oriented to person, place, and time. She has normal strength. No cranial nerve deficit. Gait normal.  Skin: Skin is warm. No rash noted. No erythema.  Psychiatric: Her mood appears anxious.  Well groomed, good eye contact.    ASSESSMENT AND PLAN:  [name] was seen today for follow-up.  Diagnoses and all orders for this visit:  Orders Placed This Encounter    Procedures  . DG Chest 2 View  . Basic metabolic panel  . CBC with Differential/Platelet  . VITAMIN D 25 Hydroxy (Vit-D Deficiency, Fractures)    Mild reactive airways disease, unspecified whether persistent Lung auscultation negative, no signs of respiratory distress. Pulse O2 not able to get because very long fingernail with red nail polish. Symbicort was recently increased, so no changes. Albuterol inh q 4-6 hours as needed. Today Singulair 10 mg added. She has an appt with immunologist in 02/2020.  -     montelukast (SINGULAIR) 10 MG tablet; Take 1 tablet (10 mg total) by mouth at bedtime.  Encounter for medication monitoring -     VITAMIN D 25 Hydroxy (Vit-D Deficiency, Fractures)  Dyspnea, unspecified type Problem has improved, not quite present now but still she doe snot feel like she is at her baseline. ? Deconditioning. Explained that after COVID 19 infection it may take a few months to completely recover. Examination today does not suggest a serious process. Instructed about warning signs.  Chest wall pain Problem can be aggravated by episodes of cough. Avoid shallow breathing.  Chronic otitis externa of right ear, unspecified type ? Seborrheic dermatitis. She wants to try ear drops, cortisporin has Hydrocortisone ,so it may help.  Essential hypertension Recheck BP: 130/80. BP adequately controlled.  In general symptoms have improved, I do not think stat work up is needed at this time. Complete recovery will take some time. Monitor for fever. Recommend treatment fingernails down and to remove nail polish for next visit.   Return in about 4 weeks (around 03/11/2020) for SOB,HTN.    Arvis Miguez G. [place], MD  [facility] [place] office.

## 2020-02-13 ENCOUNTER — Encounter: Payer: Self-pay | Admitting: Family Medicine

## 2020-02-14 ENCOUNTER — Telehealth: Payer: Self-pay

## 2020-02-14 LAB — VITAMIN D 25 HYDROXY (VIT D DEFICIENCY, FRACTURES): VITD: >120 ng/mL

## 2020-02-14 MED ORDER — NEOMYCIN-POLYMYXIN-HC 3.5-10000-1 OT SOLN: 3.0000 [drp] | Freq: Two times a day (BID) | OTIC | 0 refills | Status: AC

## 2020-02-14 NOTE — Telephone Encounter (Signed)
CRITICAL VALUE STICKER  CRITICAL VALUE: Vit D >120  RECEIVER (on-site recipient of call): Maralyn Sago, CMA  DATE & TIME NOTIFIED: 2/19 @ 12:53  MESSENGER (representative from lab): Henderson Cloud  MD NOTIFIED: Yes  TIME OF NOTIFICATION: 12:57  RESPONSE: routed to in-basket

## 2020-02-14 NOTE — Addendum Note (Signed)
Addended by: [place], Tobi Leinweber G on: 02/14/2020 01:09 PM   Modules accepted: Orders

## 2020-02-14 NOTE — Telephone Encounter (Signed)
She must stop vit D supplementation now. Thanks, BJ

## 2020-02-14 NOTE — Telephone Encounter (Signed)
I called and spoke with pt. She is aware of results & to stop vitamin d.

## 2020-02-17 ENCOUNTER — Encounter: Payer: Self-pay | Admitting: Family Medicine

## 2020-02-25 ENCOUNTER — Ambulatory Visit: Payer: BC Managed Care – PPO | Admitting: Cardiovascular Disease

## 2020-02-25 ENCOUNTER — Other Ambulatory Visit: Payer: Self-pay

## 2020-02-25 ENCOUNTER — Encounter: Payer: Self-pay | Admitting: Cardiovascular Disease

## 2020-02-25 VITALS — BP 102/76 | HR 79 | Ht 59.0 in | Wt 181.0 lb

## 2020-02-25 DIAGNOSIS — R0602 Shortness of breath: Secondary | ICD-10-CM | POA: Diagnosis not present

## 2020-02-25 DIAGNOSIS — I10 Essential (primary) hypertension: Secondary | ICD-10-CM | POA: Diagnosis not present

## 2020-02-25 NOTE — Progress Notes (Signed)
Cardiology Office Note   Date:  02/25/2020   ID:  [name], [name] 24-Jul-1966, MRN [id number]  PCP:  [name], Holly G, MD  Cardiologist:   Skeet Latch, MD   No chief complaint on file.    History of Present Illness: [name] is a 54 y.o. female here for follow up.  She was initially seen 09/2019 for the evaluation of shortness of breath.  At the time she thought she had COVID-19 several months prior.  She was getting short of breath with minimal exertion and symptoms did not improve with an inhaler.  She was referred for an echocardiogram 12/2018 that revealed LVEF 65 to 70% with grade 1 diastolic dysfunction and was otherwise unremarkable.  She was referred for a coronary CT-a given her chest discomfort and family history of CAD.  However she decided not to have it done.  Lately she has not been experiencing any chest pain.  She was diagnosed with Covid 12/2019 and has been struggling with her breathing since that time.  She sometimes has episodes of chest pain that occur when sitting or when she is trying to breathe deeply.  She notes that it is worse when she is anxious.  She has no exertional chest pain.  She likes to walk for exercise but has been unable to do this lately due to her breathing status.  She denies any lower extremity edema, orthopnea, or PND.  She is very stressed at her job because many of the people do not wear mask regularly.    Past Medical History:  Diagnosis Date  . Allergy   . Arthritis   . Atypical chest pain 10/25/2019  . Diabetes mellitus without complication ([place])    0086  . Essential hypertension 10/25/2019  . Family history of anesthesia complication    "Mama did; she has alzheimer's; any anesthesia puts her in the twilight zone"  . Gallstones   . GERD (gastroesophageal reflux disease)   . Migraine    "cluster migraine"  . Sciatic nerve injury    "right side"    Past Surgical History:  Procedure Laterality Date  . ANTERIOR CRUCIATE  LIGAMENT REPAIR Right 1996  . CHOLECYSTECTOMY N/A 08/31/2014   Procedure: LAPAROSCOPIC CHOLECYSTECTOMY WITH INTRAOPERATIVE CHOLANGIOGRAM;  Surgeon: Greer Pickerel, MD;  Location: [facility];  Service: General;  Laterality: N/A;  . KNEE ARTHROSCOPY Right 1998   with ACL reconstruction  . LAPAROSCOPIC CHOLECYSTECTOMY  08/31/2014  . SINOSCOPY  1998  . TONSILLECTOMY  ~ 1978     Current Outpatient Medications  Medication Sig Dispense Refill  . Azelastine HCl 0.15 % SOLN Use 1-2 sprays per nostril twice a day as needed for drainage. 30 mL 5  . Blood Glucose Monitoring Suppl (ACCU-CHEK AVIVA PLUS) w/Device KIT 1 Device by Does not apply route daily. 1 kit 0  . budesonide-formoterol (SYMBICORT) 160-4.5 MCG/ACT inhaler Inhale 2 puffs into the lungs 2 (two) times daily. 1 Inhaler 3  . Carbinoxamine Maleate (RYVENT) 6 MG TABS Take 1 tablet by mouth 2 (two) times daily. 60 tablet 3  . fluticasone (FLONASE) 50 MCG/ACT nasal spray Place 2 sprays into both nostrils 2 (two) times a day.    Marland Kitchen glucose blood (ACCU-CHEK AVIVA PLUS) test strip 2 times daily 180 each 1  . Lancets (ACCU-CHEK SOFT TOUCH) lancets 2/day 180 each 12  . Magnesium Gluconate (MAG-Summers) 500 (27 Mg) MG TABS Take by mouth.    . metoprolol tartrate (LOPRESSOR) 50 MG tablet TAKE 1  TABLET BY MOUTH 2 HOURS PRIOR TO CT 1 tablet 0  . montelukast (SINGULAIR) 10 MG tablet Take 1 tablet (10 mg total) by mouth at bedtime. 30 tablet 3  . PROAIR HFA 108 (90 Base) MCG/ACT inhaler INHALE 2 PUFFS INTO THE LUNGS EVERY 6 HOURS AS NEEDED FOR WHEEZING OR SHORTNESS OF BREATH 18 Summers 2  . Respiratory Therapy Supplies (ONE FLOW SPIROMETER) DEVI 1 Device by Does not apply route 3 (three) times daily as needed. 1 each 0  . SUMAtriptan (IMITREX) 50 MG tablet Take 50 mg by mouth every 2 (two) hours as needed for migraine. May repeat in 2 hours if headache persists or recurs.     No current facility-administered medications for this visit.    Allergies:   Citrus, Penicillins,  Vicodin [hydrocodone-acetaminophen], and Topiramate    Social History:  The patient  reports that she has never smoked. She has never used smokeless tobacco. She reports that she does not drink alcohol or use drugs.   Family History:  The patient's family history includes Cancer in her father; Dementia in her mother; Diabetes in her daughter and mother; Heart attack in her father and maternal grandfather; Hyperlipidemia in her daughter; Hypertension in her daughter; Stroke in her mother.    ROS:  Please see the history of present illness.   Otherwise, review of systems are positive for none.   All other systems are reviewed and negative.    PHYSICAL EXAM: VS:  BP 102/76   Pulse 79   Ht _0  (1.499 m)   Wt 181 lb (82.1 kg)   SpO2 92%   BMI 36.56 kg/m  , BMI Body mass index is 36.56 kg/m. GENERAL:  Well appearing HEENT:  Pupils equal round and reactive, fundi not visualized, oral mucosa unremarkable NECK:  No jugular venous distention, waveform within normal limits, carotid upstroke brisk and symmetric, no bruit LUNGS:  Clear to auscultation bilaterally HEART:  RRR.  PMI not displaced or sustained,S1 and S2 within normal limits, no S3, no S4, no clicks, no rubs, no  murmurs ABD:  Flat, positive bowel sounds normal in frequency in pitch, no bruits, no rebound, no guarding, no midline pulsatile mass, no hepatomegaly, no splenomegaly EXT:  2 plus pulses throughout, no edema, no cyanosis no clubbing SKIN:  No rashes no nodules NEURO:  Cranial nerves II through XII grossly intact, motor grossly intact throughout PSYCH:  Cognitively intact, oriented to person place and time   EKG:  EKG is not ordered today. The ekg ordered 10/21/19 demonstrates sinus rhythm.  Rate 63 bpm.  Non-specific ST-T changes.    Echo 10/2019: IMPRESSIONS    1. Left ventricular ejection fraction, by visual estimation, is 65 to  70%. The left ventricle has normal function. There is no left ventricular    hypertrophy.  2. Left ventricular diastolic parameters are consistent with Grade I  diastolic dysfunction (impaired relaxation).  3. Global right ventricle has normal systolic function.The right  ventricular size is normal. No increase in right ventricular wall  thickness.  4. Left atrial size was normal.  5. Right atrial size was normal.  6. The mitral valve is normal in structure. Mild mitral valve  regurgitation. No evidence of mitral stenosis.  7. The tricuspid valve is normal in structure. Tricuspid valve  regurgitation is trivial.  8. The aortic valve is normal in structure. Aortic valve regurgitation is  not visualized. No evidence of aortic valve sclerosis or stenosis.  9. The pulmonic valve was  normal in structure. Pulmonic valve  regurgitation is not visualized.  10. The inferior vena cava is normal in size with greater than 50%  respiratory variability, suggesting right atrial pressure of 3 mmHg.   Recent Labs: 06/19/2019: ALT 69; TSH 3.290 02/12/2020: BUN 11; Creatinine, Ser 0.82; Hemoglobin 13.6; Platelets 208.0; Potassium 3.9; Sodium 140    Lipid Panel No results found for: CHOL, TRIG, HDL, CHOLHDL, VLDL, LDLCALC, LDLDIRECT    Wt Readings from Last 3 Encounters:  02/25/20 181 lb (82.1 kg)  02/12/20 183 lb (83 kg)  10/21/19 188 lb 6.4 oz (85.5 kg)      ASSESSMENT AND PLAN:  # Atypical chest pain: Symptoms are very atypical.  She was referred for coronary CT but elected not to have it.  This is quite reasonable.  I think that her symptoms are very atypical and unlikely to represent ischemia.  She understands that if she changes her mind she can give [place] a call in the future.   # Shortness of breath:   Symptoms are related to Covid and improving.  Echo reveals normal systolic function and grade 1 diastolic dysfunction.  She is euvolemic on exam today.  # Essential hypertension: BP stable.     Current medicines are reviewed at length with the patient  today.  The patient does not have concerns regarding medicines.  The following changes have been made:  no change  Labs/ tests ordered today include:   No orders of the defined types were placed in this encounter.    Disposition:   FU with Freemon Binford C. Oval Linsey, MD, [facility] as needed.     Signed, Elyce Zollinger C. Oval Linsey, MD, [facility]  02/25/2020 5:04 PM    [place]

## 2020-02-25 NOTE — Patient Instructions (Signed)
Medication Instructions:  Your physician recommends that you continue on your current medications as directed. Please refer to the Current Medication list given to you today.  Lab Work: NONE  If you have labs (blood work) drawn today and your tests are completely normal, you will receive your results only by: Marland Kitchen MyChart Message (if you have MyChart) OR . A paper copy in the mail If you have any lab test that is abnormal or we need to change your treatment, we will call you to review the results.  Testing/Procedures: NONE  Follow-Up: AS NEEDED

## 2020-02-26 ENCOUNTER — Ambulatory Visit: Payer: BC Managed Care – PPO | Admitting: Allergy

## 2020-02-26 ENCOUNTER — Encounter: Payer: Self-pay | Admitting: Allergy

## 2020-02-26 VITALS — BP 110/60 | HR 78 | Temp 97.2°F | Resp 18 | Ht 59.0 in | Wt 183.2 lb

## 2020-02-26 DIAGNOSIS — H1013 Acute atopic conjunctivitis, bilateral: Secondary | ICD-10-CM

## 2020-02-26 DIAGNOSIS — J45909 Unspecified asthma, uncomplicated: Secondary | ICD-10-CM

## 2020-02-26 DIAGNOSIS — J301 Allergic rhinitis due to pollen: Secondary | ICD-10-CM | POA: Diagnosis not present

## 2020-02-26 DIAGNOSIS — J4520 Mild intermittent asthma, uncomplicated: Secondary | ICD-10-CM | POA: Diagnosis not present

## 2020-02-26 DIAGNOSIS — T50905D Adverse effect of unspecified drugs, medicaments and biological substances, subsequent encounter: Secondary | ICD-10-CM

## 2020-02-26 DIAGNOSIS — J4540 Moderate persistent asthma, uncomplicated: Secondary | ICD-10-CM

## 2020-02-26 DIAGNOSIS — T781XXD Other adverse food reactions, not elsewhere classified, subsequent encounter: Secondary | ICD-10-CM

## 2020-02-26 MED ORDER — ALBUTEROL SULFATE HFA 108 (90 BASE) MCG/ACT IN AERS: INHALATION_SPRAY | RESPIRATORY_TRACT | 2 refills | Status: AC

## 2020-02-26 MED ORDER — BUDESONIDE-FORMOTEROL FUMARATE 160-4.5 MCG/ACT IN AERO: 2.0000 | INHALATION_SPRAY | Freq: Two times a day (BID) | RESPIRATORY_TRACT | 3 refills | Status: DC

## 2020-02-26 MED ORDER — ALBUTEROL SULFATE (2.5 MG/3ML) 0.083% IN NEBU: 2.5000 mg | INHALATION_SOLUTION | Freq: Four times a day (QID) | RESPIRATORY_TRACT | 1 refills | Status: DC | PRN

## 2020-02-26 NOTE — Assessment & Plan Note (Signed)
Breaks out in hives after eating citrus fruits.  Continue to avoid citrus fruits.

## 2020-02-26 NOTE — Assessment & Plan Note (Signed)
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 Tried Claritin-D, Singulair and Flonase with some benefit. 2020 skin testing showed: positive to grass pollen only. Interim history - Ryvent caused grogginess. Still has PND.  May use azelastine nasal spray 1-2 sprays per nostril twice a day as needed for drainage.   Continue environmental control measures.  Continue Flonase 1 spray twice a day.  Nasal saline spray (i.e., Simply Saline) or nasal saline lavage (i.e., NeilMed) is recommended as needed and prior to medicated nasal sprays.  May use over the counter antihistamines such as Zyrtec (cetirizine), Claritin (loratadine), Allegra (fexofenadine), or Xyzal (levocetirizine) daily as needed.  Continue Singulair 10mg  daily.

## 2020-02-26 NOTE — Assessment & Plan Note (Signed)
   See assessment and plan as above allergic rhinitis. 

## 2020-02-26 NOTE — Patient Instructions (Addendum)
Reactive airway disease  Use nebulizer machine twice a day for 2 weeks then go down to once a day for 2 weeks then only use if needed.   Daily controller medication(s):DECREASE Symbicort 160 to 2 puffs twice a day with spacer and rinse mouth afterwards.  Start spiriva 1.50mcg 2 puffs once a day. Sample given.  If doing better with Spiriva, call after 2 weeks so we can send in a prescription.   Prior to physical activity:May use albuterol rescue inhaler 2 puffs 5 to 15 minutes prior to strenuous physical activities.  Rescue medications:May use albuterol rescue inhaler 2 puffs or nebulizer every 4 to 6 hours as needed for shortness of breath, chest tightness, coughing, and wheezing. Monitor frequency of use. Asthma control goals:  Full participation in all desired activities (may need albuterol before activity) Albuterol use two times or less a week on average (not counting use with activity) Cough interfering with sleep two times or less a month Oral steroids no more than once a year No hospitalizations  Seasonal allergic rhinitis due to pollen 2020 skin testing showed: positive to grass pollen only.  May use azelastine nasal spray 1-2 sprays per nostril twice a day as needed for drainage.   Continue environmental control measures.  Continue Flonase 1 spray twice a day.  Nasal saline spray (i.e., Simply Saline) or nasal saline lavage (i.e., NeilMed) is recommended as needed and prior to medicated nasal sprays.  May use over the counter antihistamines such as Zyrtec (cetirizine), Claritin (loratadine), Allegra (fexofenadine), or Xyzal (levocetirizine) daily as needed.  Drug reaction Past history - Hives with penicillin (over 10 years ago) and Vicodin.   Continue to avoid for now. Consider penicillin testing in future.   Adverse food reaction  Continue to avoid citrus fruits.    Follow up in 2 months or sooner if needed.

## 2020-02-26 NOTE — Assessment & Plan Note (Addendum)
Past history - Hives with penicillin (over 10 years ago) and Vicodin.  Interim history - itching after taking some type of cough medicine.  Continue to avoid for now. Consider penicillin testing in future.

## 2020-02-26 NOTE — Progress Notes (Signed)
Follow Up Note  RE: [name] MRN: [id number] DOB: [birth year] Date of Office Visit: 02/26/2020  Referring provider: Martinique, Betty G, MD Primary care provider: Martinique, Betty G, MD  Chief Complaint: Asthma and Allergic Rhinitis   History of Present Illness: I had the pleasure of seeing [name] for a follow up visit at the [facility] on 02/26/2020. She is a 54 y.o. female, who is being followed for reactive airway disease, allergic rhino conjunctivitis, drug reaction, adverse food reaction. Her previous allergy office visit was on 11/25/2019 with Dr. Maudie Mercury. Today is a regular follow up visit.  Reactive airway disease Patient had sinus headaches, fevers, anosmia, and had positive COVID-19 testing in January.  She had an asthma exacerbation during this time. Currently on Symbicort 160 4 puffs twice a day. Breathing is slowly improving. She has some brain fog.  Started on Singulair 10m daily as well.  Using albuterol every 6 hours while awake.  Chest xray showed some mild bronchitic change.   Mainly has dyspnea on exertion.   Patient saw cardiology and no issues.   Allergic rhino conjunctivitis Ryvent caused too much grogginess so stopped using it.  Currently on Flonase 1 spray twice a day with some benefit.  Still having some PND. Has azelastine nasal spray at home.   Drug reaction Had some itching after taking some type of cough medicine.  Adverse food reaction Avoiding citrus fruits.   Assessment and Plan: LTerrishais a 54y.o. female with: Reactive airway disease Patient had covid in January and her breathing is slowly improved.  ACT score 15 today.  Today's spirometry was normal.   Use albuterol nebulizer machine twice a day for 2 weeks then go down to once a day for 2 weeks then only use if needed.   Daily controller medication(s):DECREASE Symbicort 160 to 2 puffs twice a day with spacer and rinse mouth afterwards.  Start Spiriva 1.253m 2  puffs once a day. Sample given.  If doing better with Spiriva, call after 2 weeks so we can send in a prescription.   Prior to physical activity:May use albuterol rescue inhaler 2 puffs 5 to 15 minutes prior to strenuous physical activities.  Rescue medications:May use albuterol rescue inhaler 2 puffs or nebulizer every 4 to 6 hours as needed for shortness of breath, chest tightness, coughing, and wheezing. Monitor frequency of use.  Cardiology work up - (Echo reveals normal systolic function and grade 1 diastolic dysfunction).  Seasonal allergic rhinitis due to pollen Past history - Rhino conjunctivitis symptoms for the past 20 years during the spring through fall. Tried Claritin-D, Singulair and Flonase with some benefit. 2020 skin testing showed: positive to grass pollen only. Interim history - Ryvent caused grogginess. Still has PND.  May use azelastine nasal spray 1-2 sprays per nostril twice a day as needed for drainage.   Continue environmental control measures.  Continue Flonase 1 spray twice a day.  Nasal saline spray (i.e., Simply Saline) or nasal saline lavage (i.e., NeilMed) is recommended as needed and prior to medicated nasal sprays.  May use over the counter antihistamines such as Zyrtec (cetirizine), Claritin (loratadine), Allegra (fexofenadine), or Xyzal (levocetirizine) daily as needed.  Continue Singulair 1041maily.  Allergic conjunctivitis of both eyes  See assessment and plan as above allergic rhinitis.  Drug reaction Past history - Hives with penicillin (over 10 years ago) and Vicodin.  Interim history - itching after taking some type of cough medicine.  Continue  to avoid for now. Consider penicillin testing in future.   Adverse food reaction Breaks out in hives after eating citrus fruits.  Continue to avoid citrus fruits.   Return in about 2 months (around 04/27/2020).  Meds ordered this encounter  Medications  . albuterol (PROAIR HFA) 108 (90  Base) MCG/ACT inhaler    Sig: INHALE 2 PUFFS INTO THE LUNGS EVERY 6 HOURS AS NEEDED FOR WHEEZING OR SHORTNESS OF BREATH    Dispense:  18 g    Refill:  2  . budesonide-formoterol (SYMBICORT) 160-4.5 MCG/ACT inhaler    Sig: Inhale 2 puffs into the lungs 2 (two) times daily.    Dispense:  1 Inhaler    Refill:  3  . albuterol (PROVENTIL) (2.5 MG/3ML) 0.083% nebulizer solution    Sig: Take 3 mLs (2.5 mg total) by nebulization every 6 (six) hours as needed for wheezing or shortness of breath.    Dispense:  360 mL    Refill:  1   Diagnostics: Spirometry:  Tracings reviewed. Her effort: Good reproducible efforts. FVC: 1.83L FEV1: 1.56L, 86% predicted FEV1/FVC ratio: 85% Interpretation: Spirometry consistent with normal pattern.  Please see scanned spirometry results for details.  Medication List:  Current Outpatient Medications  Medication Sig Dispense Refill  . albuterol (PROAIR HFA) 108 (90 Base) MCG/ACT inhaler INHALE 2 PUFFS INTO THE LUNGS EVERY 6 HOURS AS NEEDED FOR WHEEZING OR SHORTNESS OF BREATH 18 g 2  . Azelastine HCl 0.15 % SOLN Use 1-2 sprays per nostril twice a day as needed for drainage. 30 mL 5  . Blood Glucose Monitoring Suppl (ACCU-CHEK AVIVA PLUS) w/Device KIT 1 Device by Does not apply route daily. 1 kit 0  . budesonide-formoterol (SYMBICORT) 160-4.5 MCG/ACT inhaler Inhale 2 puffs into the lungs 2 (two) times daily. 1 Inhaler 3  . fluticasone (FLONASE) 50 MCG/ACT nasal spray Place 2 sprays into both nostrils 2 (two) times a day.    Marland Kitchen glucose blood (ACCU-CHEK AVIVA PLUS) test strip 2 times daily 180 each 1  . Lancets (ACCU-CHEK SOFT TOUCH) lancets 2/day 180 each 12  . Magnesium Gluconate (MAG-G) 500 (27 Mg) MG TABS Take by mouth.    . metoprolol tartrate (LOPRESSOR) 50 MG tablet TAKE 1 TABLET BY MOUTH 2 HOURS PRIOR TO CT 1 tablet 0  . montelukast (SINGULAIR) 10 MG tablet Take 1 tablet (10 mg total) by mouth at bedtime. 30 tablet 3  . Respiratory Therapy Supplies (ONE FLOW  SPIROMETER) DEVI 1 Device by Does not apply route 3 (three) times daily as needed. 1 each 0  . SUMAtriptan (IMITREX) 50 MG tablet Take 50 mg by mouth every 2 (two) hours as needed for migraine. May repeat in 2 hours if headache persists or recurs.    Marland Kitchen albuterol (PROVENTIL) (2.5 MG/3ML) 0.083% nebulizer solution Take 3 mLs (2.5 mg total) by nebulization every 6 (six) hours as needed for wheezing or shortness of breath. 360 mL 1   No current facility-administered medications for this visit.   Allergies: Allergies  Allergen Reactions  . Citrus Hives  . Penicillins Hives  . Vicodin [Hydrocodone-Acetaminophen] Hives  . Topiramate    I reviewed her past medical history, social history, family history, and environmental history and no significant changes have been reported from her previous visit.  Review of Systems  Constitutional: Negative for appetite change, chills, fever and unexpected weight change.  HENT: Positive for congestion and rhinorrhea. Negative for postnasal drip.   Eyes: Negative for itching.  Respiratory: Positive for cough,  shortness of breath and wheezing.   Cardiovascular: Negative for chest pain.  Gastrointestinal: Negative for abdominal pain.  Genitourinary: Negative for difficulty urinating.  Skin: Negative for rash.  Allergic/Immunologic: Positive for environmental allergies. Negative for food allergies.  Neurological: Positive for headaches.   Objective: BP 110/60   Pulse 78   Temp (!) 97.2 F (36.2 C) (Temporal)   Resp 18   Ht '4\' 11"'  (1.499 m)   Wt 183 lb 3.2 oz (83.1 kg)   SpO2 96%   BMI 37.00 kg/m  Body mass index is 37 kg/m. Physical Exam  Constitutional: She is oriented to person, place, and time. She appears well-developed and well-nourished.  HENT:  Head: Normocephalic and atraumatic.  Right Ear: External ear normal.  Left Ear: External ear normal.  Nose: Nose normal.  Mouth/Throat: Oropharynx is clear and moist.  Eyes: Conjunctivae and EOM  are normal.  Cardiovascular: Normal rate, regular rhythm and normal heart sounds. Exam reveals no gallop and no friction rub.  No murmur heard. Pulmonary/Chest: Effort normal and breath sounds normal. She has no wheezes. She has no rales.  Abdominal: Soft.  Musculoskeletal:     Cervical back: Neck supple.  Neurological: She is alert and oriented to person, place, and time.  Skin: Skin is warm. No rash noted.  Psychiatric: She has a normal mood and affect. Her behavior is normal.  Nursing note and vitals reviewed.  Previous notes and tests were reviewed. The plan was reviewed with the patient/family, and all questions/concerned were addressed.  It was my pleasure to see [name] today and participate in her care. Please feel free to contact me with any questions or concerns.  Sincerely,  Rexene Alberts, DO Allergy & Immunology  [facility] office: [phone] [facility] office: [place] office: [phone]

## 2020-02-26 NOTE — Assessment & Plan Note (Addendum)
Patient had covid in January and her breathing is slowly improved.  ACT score 15 today.  Today's spirometry was normal.   Use albuterol nebulizer machine twice a day for 2 weeks then go down to once a day for 2 weeks then only use if needed.   Daily controller medication(s):DECREASE Symbicort 160 to 2 puffs twice a day with spacer and rinse mouth afterwards.  Start Spiriva 1.15mcg 2 puffs once a day. Sample given.  If doing better with Spiriva, call after 2 weeks so we can send in a prescription.   Prior to physical activity:May use albuterol rescue inhaler 2 puffs 5 to 15 minutes prior to strenuous physical activities.  Rescue medications:May use albuterol rescue inhaler 2 puffs or nebulizer every 4 to 6 hours as needed for shortness of breath, chest tightness, coughing, and wheezing. Monitor frequency of use.  Cardiology work up - (Echo reveals normal systolic function and grade 1 diastolic dysfunction).

## 2020-03-05 ENCOUNTER — Other Ambulatory Visit: Payer: Self-pay

## 2020-03-06 ENCOUNTER — Encounter: Payer: Self-pay | Admitting: Family Medicine

## 2020-03-06 ENCOUNTER — Ambulatory Visit (INDEPENDENT_AMBULATORY_CARE_PROVIDER_SITE_OTHER): Payer: BC Managed Care – PPO | Admitting: Family Medicine

## 2020-03-06 VITALS — BP 128/80 | HR 95 | Temp 96.1°F | Resp 12 | Ht 59.0 in | Wt 180.0 lb

## 2020-03-06 DIAGNOSIS — R5383 Other fatigue: Secondary | ICD-10-CM | POA: Diagnosis not present

## 2020-03-06 DIAGNOSIS — M791 Myalgia, unspecified site: Secondary | ICD-10-CM

## 2020-03-06 DIAGNOSIS — E1169 Type 2 diabetes mellitus with other specified complication: Secondary | ICD-10-CM | POA: Diagnosis not present

## 2020-03-06 DIAGNOSIS — E669 Obesity, unspecified: Secondary | ICD-10-CM | POA: Diagnosis not present

## 2020-03-06 DIAGNOSIS — I10 Essential (primary) hypertension: Secondary | ICD-10-CM | POA: Diagnosis not present

## 2020-03-06 DIAGNOSIS — H9203 Otalgia, bilateral: Secondary | ICD-10-CM

## 2020-03-06 DIAGNOSIS — F419 Anxiety disorder, unspecified: Secondary | ICD-10-CM

## 2020-03-06 LAB — MICROALBUMIN / CREATININE URINE RATIO
Creatinine,U: 162.1 mg/dL
Microalb Creat Ratio: 0.7 mg/g (ref 0.0–30.0)
Microalb, Ur: 1.2 mg/dL (ref 0.0–1.9)

## 2020-03-06 LAB — C-REACTIVE PROTEIN: CRP: <1 mg/dL (ref 0.5–20.0)

## 2020-03-06 LAB — CK: Total CK: 178 U/L — ABNORMAL HIGH (ref 7–177)

## 2020-03-06 LAB — HEMOGLOBIN A1C: Hgb A1c MFr Bld: 8.8 % — ABNORMAL HIGH (ref 4.6–6.5)

## 2020-03-06 MED ORDER — SERTRALINE HCL 25 MG PO TABS: 25.0000 mg | ORAL_TABLET | Freq: Every day | ORAL | 1 refills | Status: DC

## 2020-03-06 NOTE — Patient Instructions (Addendum)
A few things to remember from today's visit:   Essential hypertension  Diabetes mellitus type 2 in obese (HCC) - Plan: Microalbumin / creatinine urine ratio, Hemoglobin A1c, Fructosamine  Fatigue, unspecified type - Plan: C-reactive protein  Myalgia - Plan: CK, C-reactive protein  Anxiety disorder, unspecified type - Plan: sertraline (ZOLOFT) 25 MG tablet  As far as you can perform your work from home I think you could if your employer agrees.  Today we started Sertraline 25 mg , this type of medications can increase suicidal risk. This is more prevalent among children,adolecents, and young adults with major depression or other psychiatric disorders. It can also make depression worse. Most common side effects are gastrointestinal, self limited after a few weeks: diarrhea, nausea, constipation  Or diarrhea among some.  In general it is well tolerated. We will follow closely.      Please be sure medication list is accurate. If a new problem present, please set up appointment sooner than planned today.

## 2020-03-06 NOTE — Progress Notes (Signed)
HPI:   [name] is a 54 y.o. female, who is here today to follow on recent OV.  Since her last visit she has seen her immunologist. Still having some SOB. Some of her medications were adjusted.' Albuterol neb added.  She has been evaluated for SOB 10/21/2019 by cardiologist.  Bilateral earache. Otic drops for left ear did not help. Negative for drainage or hearing loss. Last visit she was c/o left earache, ear examination was negative except for minimal ear canal erythema right ear. . DM II: She is not checking BS's. She is on non pharmacologic treatment. Denies abdominal pain, nausea,vomiting, polydipsia,polyuria, or polyphagia.  Lab Results  Component Value Date   HGBA1C 8.1 (H) 05/04/2017   HTN: She has reported being on Metoprolol but reviewing noted this was a one time Rx from cardiologist to take before procedure. She is on non pharmacologic treatment. Home BP around same that is today. Denies severe/frequent headache, visual changes, chest pain, dyspnea, palpitation, claudication, focal weakness, or edema.  Lab Results  Component Value Date   CREATININE 0.82 02/12/2020   BUN 11 02/12/2020   NA 140 02/12/2020   K 3.9 02/12/2020   CL 102 02/12/2020   CO2 30 02/12/2020    Anxiety getting worse. + Stress. Last week she felt "depressed", she feels pressure from co-workers to go back to work. Negative for suicidal thoughts.  Fatigue, "very tired." Wonders if this is related to low vit D. Vit D supplementation was discontinued because it was elevated.   Since she had COVID 19 infection,she does not want to go out. She states that she is sure she got infected at work because mask is not mandatory. She can perform all her job functions from home, so she would like to continue working from home, needs a letter for her employer.  + Generalized joint pain and daily intermittent muscle cramps that started after COVID 19 infection. Knee pain mainly  and RUE cramps. She has Hx of OA. She has not noted joint edema or erythema. She has not identified exacerbating or alleviated factors for cramps.  Review of Systems  Constitutional: Negative for activity change, appetite change and fever.  HENT: Negative for mouth sores, nosebleeds and sore throat.   Eyes: Negative for redness and visual disturbance.  Respiratory: Negative for cough and wheezing.   Cardiovascular: Negative for chest pain, palpitations and leg swelling.  Gastrointestinal:       Negative for changes in bowel habits.  Genitourinary: Negative for decreased urine volume, dysuria and hematuria.  Musculoskeletal: Negative for gait problem.  Skin: Negative for rash and wound.  Allergic/Immunologic: Positive for environmental allergies.  Neurological: Negative for syncope, weakness and headaches.  Psychiatric/Behavioral: Negative for confusion. The patient is nervous/anxious.   Rest see pertinent positives and negatives per HPI.   Current Outpatient Medications on File Prior to Visit  Medication Sig Dispense Refill  . albuterol (PROAIR HFA) 108 (90 Base) MCG/ACT inhaler INHALE 2 PUFFS INTO THE LUNGS EVERY 6 HOURS AS NEEDED FOR WHEEZING OR SHORTNESS OF BREATH 18 g 2  . albuterol (PROVENTIL) (2.5 MG/3ML) 0.083% nebulizer solution Take 3 mLs (2.5 mg total) by nebulization every 6 (six) hours as needed for wheezing or shortness of breath. 360 mL 1  . Azelastine HCl 0.15 % SOLN Use 1-2 sprays per nostril twice a day as needed for drainage. 30 mL 5  . Blood Glucose Monitoring Suppl (ACCU-CHEK AVIVA PLUS) w/Device KIT 1 Device by Does not  apply route daily. 1 kit 0  . budesonide-formoterol (SYMBICORT) 160-4.5 MCG/ACT inhaler Inhale 2 puffs into the lungs 2 (two) times daily. 1 Inhaler 3  . fluticasone (FLONASE) 50 MCG/ACT nasal spray Place 2 sprays into both nostrils 2 (two) times a day.    Marland Kitchen glucose blood (ACCU-CHEK AVIVA PLUS) test strip 2 times daily 180 each 1  . Lancets  (ACCU-CHEK SOFT TOUCH) lancets 2/day 180 each 12  . Magnesium Gluconate (MAG-G) 500 (27 Mg) MG TABS Take by mouth.    . metoprolol tartrate (LOPRESSOR) 50 MG tablet TAKE 1 TABLET BY MOUTH 2 HOURS PRIOR TO CT 1 tablet 0  . montelukast (SINGULAIR) 10 MG tablet Take 1 tablet (10 mg total) by mouth at bedtime. 30 tablet 3  . Respiratory Therapy Supplies (ONE FLOW SPIROMETER) DEVI 1 Device by Does not apply route 3 (three) times daily as needed. 1 each 0  . SUMAtriptan (IMITREX) 50 MG tablet Take 50 mg by mouth every 2 (two) hours as needed for migraine. May repeat in 2 hours if headache persists or recurs.     No current facility-administered medications on file prior to visit.   Past Medical History:  Diagnosis Date  . Allergy   . Arthritis   . Atypical chest pain 10/25/2019  . Diabetes mellitus without complication ([place])    8177  . Essential hypertension 10/25/2019  . Family history of anesthesia complication    "Mama did; she has alzheimer's; any anesthesia puts her in the twilight zone"  . Gallstones   . GERD (gastroesophageal reflux disease)   . Migraine    "cluster migraine"  . Sciatic nerve injury    "right side"   Allergies  Allergen Reactions  . Citrus Hives  . Penicillins Hives  . Vicodin [Hydrocodone-Acetaminophen] Hives  . Topiramate    Social History   Socioeconomic History  . Marital status: Divorced    Spouse name: Not on file  . Number of children: Not on file  . Years of education: Not on file  . Highest education level: Not on file  Occupational History  . Not on file  Tobacco Use  . Smoking status: Never Smoker  . Smokeless tobacco: Never Used  Substance and Sexual Activity  . Alcohol use: No  . Drug use: No  . Sexual activity: Yes  Other Topics Concern  . Not on file  Social History Narrative  . Not on file   Social Determinants of Health   Financial Resource Strain:   . Difficulty of Paying Living Expenses:   Food Insecurity:   . Worried  About Charity fundraiser in the Last Year:   . Arboriculturist in the Last Year:   Transportation Needs:   . Film/video editor (Medical):   Marland Kitchen Lack of Transportation (Non-Medical):   Physical Activity:   . Days of Exercise per Week:   . Minutes of Exercise per Session:   Stress:   . Feeling of Stress :   Social Connections:   . Frequency of Communication with Friends and Family:   . Frequency of Social Gatherings with Friends and Family:   . Attends Religious Services:   . Active Member of Clubs or Organizations:   . Attends Archivist Meetings:   Marland Kitchen Marital Status:    Vitals:   03/06/20 1001  BP: 128/80  Pulse: 95  Resp: 12  Temp: (!) 96.1 F (35.6 C)  SpO2: 95%   Wt Readings from Last 3  Encounters:  03/06/20 180 lb (81.6 kg)  02/26/20 183 lb 3.2 oz (83.1 kg)  02/25/20 181 lb (82.1 kg)    Body mass index is 36.36 kg/m.  Physical Exam  Nursing note and vitals reviewed. Constitutional: She is oriented to person, place, and time. She appears well-developed. No distress.  HENT:  Head: Normocephalic and atraumatic.  Right Ear: Tympanic membrane normal. No tenderness.  Left Ear: Tympanic membrane and external ear normal. No tenderness.  Mouth/Throat: Oropharynx is clear and moist and mucous membranes are normal.  No cerumen in ear canal,bilateral.   Eyes: Pupils are equal, round, and reactive to light. Conjunctivae are normal.  Cardiovascular: Normal rate and regular rhythm.  No murmur heard. Pulses:      Dorsalis pedis pulses are 2+ on the right side and 2+ on the left side.  Respiratory: Effort normal and breath sounds normal. No respiratory distress.  GI: Soft. She exhibits no mass. There is no hepatomegaly. There is no abdominal tenderness.  Musculoskeletal:        General: No edema.  Lymphadenopathy:    She has no cervical adenopathy.  Neurological: She is alert and oriented to person, place, and time. She has normal strength. No cranial nerve  deficit. Gait normal.  Skin: Skin is warm. No rash noted. No erythema.  Psychiatric: Her mood appears anxious.  Well groomed, good eye contact.   Diabetic Foot Exam - Simple   Simple Foot Form Diabetic Foot exam was performed with the following findings: Yes 03/06/2020 10:47 AM  Visual Inspection No deformities, no ulcerations, no other skin breakdown bilaterally: Yes Sensation Testing Intact to touch and monofilament testing bilaterally: Yes Pulse Check Posterior Tibialis and Dorsalis pulse intact bilaterally: Yes Comments     ASSESSMENT AND PLAN:  [name] was seen today for follow-up.  Diagnoses and all orders for this visit:  Orders Placed This Encounter  Procedures  . Microalbumin / creatinine urine ratio  . Hemoglobin A1c  . Fructosamine  . CK  . C-reactive protein   Lab Results  Component Value Date   CRP <1.0 03/06/2020   Lab Results  Component Value Date   HGBA1C 8.8 (H) 03/06/2020   Lab Results  Component Value Date   MICROALBUR 1.2 03/06/2020    Diabetes mellitus type 2 in obese ([place]) HgA1C was not at goal last time checked. Recommendations according to [facility] results will be given.  Regular exercise and healthy diet with avoidance of added sugar food intake is an important part of treatment and recommended. Annual eye exam, periodic dental and foot care recommended.  Essential hypertension BP today adequately controlled. With her BP monitor LUE BP mildly elevated at 149/94. Continue low salt diet and monitoring BP at home. Wt loss will help.  Fatigue, unspecified type Explained that it is not uncommon to be fatigue, sometimes for months,after viral illness , mainly COVID 19 infection. Some of her chronic medical problems can also be contributing factors. SOB she reports could be deconditioning. A healthful diet and regular physical activity may help.  Myalgia + Arthralgias, ? Fibromyalgia. No trigger points today. Further recommendations  according to lab results.  Anxiety disorder, unspecified type This problem could explain some of her above problems. Recommended SSRI. After discussion of some side effects she agrees with taking Sertraline low dose. I will see her back in 4-6 weeks, before if needed.  Letter for employer given to work for home as far as she can perform all her job functions from home.  -  sertraline (ZOLOFT) 25 MG tablet; Take 1 tablet (25 mg total) by mouth daily.  Earache symptoms in both ears ? Eustachian tube dysfunction. Because HTN, I do not recommend OTC decongestants. If problem is persistent or gets worse, ENT evaluation will be considered.  She also is asking if she can get COVID 19 vaccine and when. She has recovered , so she can have it at any time, I usually recommend 2-3 months after infection and before or after 2 weeks of any other vaccine.   Return in about 6 weeks (around 04/17/2020) for Anxiety and myalgias.  A few things to remember from today's visit:   Essential hypertension  Diabetes mellitus type 2 in obese ([place]) - Plan: Microalbumin / creatinine urine ratio, Hemoglobin A1c, Fructosamine  Fatigue, unspecified type - Plan: C-reactive protein  Myalgia - Plan: CK, C-reactive protein  Anxiety disorder, unspecified type - Plan: sertraline (ZOLOFT) 25 MG tablet  As far as you can perform your work from home I think you could if your employer agrees.  Today we started Sertraline 25 mg , this type of medications can increase suicidal risk. This is more prevalent among children,adolecents, and young adults with major depression or other psychiatric disorders. It can also make depression worse. Most common side effects are gastrointestinal, self limited after a few weeks: diarrhea, nausea, constipation  Or diarrhea among some.  In general it is well tolerated. We will follow closely.  Please be sure medication list is accurate. If a new problem present, please set up  appointment sooner than planned today.   Julia Alkhatib G. Martinique, MD  [facility]. [place] office.

## 2020-03-09 ENCOUNTER — Encounter: Payer: Self-pay | Admitting: Family Medicine

## 2020-03-09 ENCOUNTER — Other Ambulatory Visit: Payer: Self-pay

## 2020-03-09 MED ORDER — METFORMIN HCL 500 MG PO TABS: 500.0000 mg | ORAL_TABLET | Freq: Two times a day (BID) | ORAL | 3 refills | Status: DC

## 2020-03-10 LAB — FRUCTOSAMINE: Fructosamine: 302 umol/L — ABNORMAL HIGH (ref 205–285)

## 2020-03-11 ENCOUNTER — Ambulatory Visit: Payer: BC Managed Care – PPO | Admitting: Family Medicine

## 2020-03-11 ENCOUNTER — Encounter: Payer: Self-pay | Admitting: Family Medicine

## 2020-03-11 ENCOUNTER — Other Ambulatory Visit: Payer: Self-pay

## 2020-03-11 DIAGNOSIS — H9209 Otalgia, unspecified ear: Secondary | ICD-10-CM

## 2020-03-12 ENCOUNTER — Telehealth: Payer: Self-pay | Admitting: Allergy

## 2020-03-12 MED ORDER — SPIRIVA RESPIMAT 1.25 MCG/ACT IN AERS: 2.0000 | INHALATION_SPRAY | Freq: Every day | RESPIRATORY_TRACT | 5 refills | Status: DC

## 2020-03-12 NOTE — Telephone Encounter (Signed)
Patient called and said that she needed to have the spiriva call into [facility] on [address] . [phone].

## 2020-03-12 NOTE — Telephone Encounter (Signed)
Prescription has been sent in to the requested pharmacy. Called patient and advised. Patient verbalized understanding.

## 2020-03-13 ENCOUNTER — Encounter: Payer: Self-pay | Admitting: Family Medicine

## 2020-03-23 ENCOUNTER — Other Ambulatory Visit: Payer: Self-pay

## 2020-03-23 ENCOUNTER — Encounter (INDEPENDENT_AMBULATORY_CARE_PROVIDER_SITE_OTHER): Payer: Self-pay | Admitting: Otolaryngology

## 2020-03-23 ENCOUNTER — Ambulatory Visit (INDEPENDENT_AMBULATORY_CARE_PROVIDER_SITE_OTHER): Payer: BC Managed Care – PPO | Admitting: Otolaryngology

## 2020-03-23 VITALS — Temp 98.1°F

## 2020-03-23 DIAGNOSIS — J310 Chronic rhinitis: Secondary | ICD-10-CM | POA: Diagnosis not present

## 2020-03-23 NOTE — Progress Notes (Signed)
HPI: [name] is a 54 y.o. female who presents is referred by Dr. Martinique for evaluation of sinuses and ear complaints.  She has had previous surgery by Dr Cynda Familia in 1995 because of trouble breathing as well as smelling.  She did well following the surgery.  She does have history of allergies and uses Flonase twice daily as well as Zyrtec.  She still complains of chronic drainage and occasional headache.  She has not really described much yellow-green discharge from her nose.  Her main complaints seems to be a lot of "sinus drainage".  She also has occasional ear pain but has not noted much hearing problems. She had T&A performed when she was 11.  Past Medical History:  Diagnosis Date  . Allergy   . Arthritis   . Atypical chest pain 10/25/2019  . Diabetes mellitus without complication ([place])    0177  . Essential hypertension 10/25/2019  . Family history of anesthesia complication    "Mama did; she has alzheimer's; any anesthesia puts her in the twilight zone"  . Gallstones   . GERD (gastroesophageal reflux disease)   . Migraine    "cluster migraine"  . Sciatic nerve injury    "right side"   Past Surgical History:  Procedure Laterality Date  . ANTERIOR CRUCIATE LIGAMENT REPAIR Right 1996  . CHOLECYSTECTOMY N/A 08/31/2014   Procedure: LAPAROSCOPIC CHOLECYSTECTOMY WITH INTRAOPERATIVE CHOLANGIOGRAM;  Surgeon: Greer Pickerel, MD;  Location: [facility];  Service: General;  Laterality: N/A;  . KNEE ARTHROSCOPY Right 1998   with ACL reconstruction  . LAPAROSCOPIC CHOLECYSTECTOMY  08/31/2014  . SINOSCOPY  1998  . TONSILLECTOMY  ~ 1978   Social History   Socioeconomic History  . Marital status: Divorced    Spouse name: Not on file  . Number of children: Not on file  . Years of education: Not on file  . Highest education level: Not on file  Occupational History  . Not on file  Tobacco Use  . Smoking status: Never Smoker  . Smokeless tobacco: Never Used  Substance and Sexual Activity  .  Alcohol use: No  . Drug use: No  . Sexual activity: Yes  Other Topics Concern  . Not on file  Social History Narrative  . Not on file   Social Determinants of Health   Financial Resource Strain:   . Difficulty of Paying Living Expenses:   Food Insecurity:   . Worried About Charity fundraiser in the Last Year:   . Arboriculturist in the Last Year:   Transportation Needs:   . Film/video editor (Medical):   Marland Kitchen Lack of Transportation (Non-Medical):   Physical Activity:   . Days of Exercise per Week:   . Minutes of Exercise per Session:   Stress:   . Feeling of Stress :   Social Connections:   . Frequency of Communication with Friends and Family:   . Frequency of Social Gatherings with Friends and Family:   . Attends Religious Services:   . Active Member of Clubs or Organizations:   . Attends Archivist Meetings:   Marland Kitchen Marital Status:    Family History  Problem Relation Age of Onset  . Dementia Mother   . Stroke Mother   . Diabetes Mother   . Cancer Father        prostate  . Heart attack Father   . Diabetes Daughter   . Hyperlipidemia Daughter   . Hypertension Daughter   . Heart  attack Maternal Grandfather   . Allergic rhinitis Neg Hx   . Angioedema Neg Hx   . Asthma Neg Hx   . Atopy Neg Hx   . Eczema Neg Hx   . Immunodeficiency Neg Hx   . Urticaria Neg Hx    Allergies  Allergen Reactions  . Citrus Hives  . Penicillins Hives  . Vicodin [Hydrocodone-Acetaminophen] Hives  . Topiramate    Prior to Admission medications   Medication Sig Start Date End Date Taking? Authorizing Provider  albuterol (PROAIR HFA) 108 (90 Base) MCG/ACT inhaler INHALE 2 PUFFS INTO THE LUNGS EVERY 6 HOURS AS NEEDED FOR WHEEZING OR SHORTNESS OF BREATH 02/26/20  Yes Garnet Sierras, DO  albuterol (PROVENTIL) (2.5 MG/3ML) 0.083% nebulizer solution Take 3 mLs (2.5 mg total) by nebulization every 6 (six) hours as needed for wheezing or shortness of breath. 02/26/20  Yes Garnet Sierras, DO   Azelastine HCl 0.15 % SOLN Use 1-2 sprays per nostril twice a day as needed for drainage. 09/30/19  Yes Garnet Sierras, DO  Blood Glucose Monitoring Suppl (ACCU-CHEK AVIVA PLUS) w/Device KIT 1 Device by Does not apply route daily. 04/02/19  Yes Martinique, Betty G, MD  budesonide-formoterol [facility]) 160-4.5 MCG/ACT inhaler Inhale 2 puffs into the lungs 2 (two) times daily. 02/26/20  Yes Garnet Sierras, DO  fluticasone (FLONASE) 50 MCG/ACT nasal spray Place 2 sprays into both nostrils 2 (two) times a day.   Yes [provider]  glucose blood (ACCU-CHEK AVIVA PLUS) test strip 2 times daily 04/02/19  Yes Martinique, Betty G, MD  Lancets (ACCU-CHEK SOFT TOUCH) lancets 2/day 04/02/19  Yes Martinique, Betty G, MD  Magnesium Gluconate (MAG-G) 500 (27 Mg) MG TABS Take by mouth.   Yes [provider]  metFORMIN (GLUCOPHAGE) 500 MG tablet Take 1 tablet (500 mg total) by mouth 2 (two) times daily with a meal. 03/09/20  Yes Martinique, Betty G, MD  montelukast (SINGULAIR) 10 MG tablet Take 1 tablet (10 mg total) by mouth at bedtime. 02/12/20  Yes Martinique, Betty G, MD  Respiratory Therapy Supplies (ONE FLOW SPIROMETER) DEVI 1 Device by Does not apply route 3 (three) times daily as needed. 01/29/20  Yes Martinique, Betty G, MD  sertraline (ZOLOFT) 25 MG tablet Take 1 tablet (25 mg total) by mouth daily. 03/06/20  Yes Martinique, Betty G, MD  SUMAtriptan (IMITREX) 50 MG tablet Take 50 mg by mouth every 2 (two) hours as needed for migraine. May repeat in 2 hours if headache persists or recurs.   Yes [provider]  Tiotropium Bromide Monohydrate (SPIRIVA RESPIMAT) 1.25 MCG/ACT AERS Inhale 2 puffs into the lungs daily. 03/12/20  Yes Garnet Sierras, DO     Positive ROS: She is having no headache today.  All other systems have been reviewed and were otherwise negative with the exception of those mentioned in the HPI and as above.  Physical Exam: Constitutional: Alert, well-appearing, no acute distress Ears: External ears without  lesions or tenderness. Ear canals are clear bilaterally with intact, clear TMs bilaterally.  On tuning fork testing she had slight upper frequency sensorineural hearing loss with the 1024 tuning fork but this was minimal.  She heard about the same in both ears with AC > BC bilaterally with a 512 tuning fork. Nasal: External nose without lesions. Septum midline.. Clear nasal passages.  No polyps noted. Nasal endoscopy was performed bilaterally and on nasal endoscopy both middle meatus regions were clear bilaterally.  No mucopurulent discharge noted  from the middle meatus, posterior ethmoid or sphenoid region.  No polyps noted. Oral: Lips and gums without lesions. Tongue and palate mucosa without lesions. Posterior oropharynx clear. Neck: No palpable adenopathy or masses Respiratory: Breathing comfortably  Skin: No facial/neck lesions or rash noted.  Nasal/sinus endoscopy  Date/Time: 03/23/2020 5:42 PM Performed by: Rozetta Nunnery, MD Authorized by: Rozetta Nunnery, MD   Consent:    Consent obtained:  Verbal   Consent given by:  Patient   Risks discussed:  Pain Procedure details:    Indications: sino-nasal symptoms     Medication:  Afrin   Scope location: bilateral nare   Sinus:    Right middle meatus: normal     Left middle meatus: normal     Right nasopharynx: normal     Left nasopharynx: normal   Comments:     On nasal endoscopy she had only clear mucus discharge within the nasal cavity.  Both middle meatus regions were clear with no signs of infection or polyps.    Assessment: Chronic rhinitis Presently clear TMs bilaterally.  Plan: Would recommend continue use of the Flonase as well as use of the Zyrtec to help treat her allergies.  Discussed with her concerning use of saline irrigation to help with the chronic postnasal drainage and sinus discharge and suggested trying Xlear saline rinse. The only way to adequately visualize the sinuses would be with a CT scan if  headache or pressure symptoms warranted surgical intervention and reviewed this with her.  Initially I would recommend treatment with medical therapy however if she continues to have frequent headaches she will call us back to schedule CT scan of the sinuses.   Radene Journey, MD   CC:

## 2020-04-06 DIAGNOSIS — G44009 Cluster headache syndrome, unspecified, not intractable: Secondary | ICD-10-CM | POA: Diagnosis not present

## 2020-04-17 ENCOUNTER — Encounter: Payer: Self-pay | Admitting: Family Medicine

## 2020-04-17 ENCOUNTER — Telehealth (INDEPENDENT_AMBULATORY_CARE_PROVIDER_SITE_OTHER): Payer: BC Managed Care – PPO | Admitting: Family Medicine

## 2020-04-17 VITALS — BP 130/90 | Temp 97.5°F | Ht 59.0 in

## 2020-04-17 DIAGNOSIS — R05 Cough: Secondary | ICD-10-CM

## 2020-04-17 DIAGNOSIS — R059 Cough, unspecified: Secondary | ICD-10-CM

## 2020-04-17 DIAGNOSIS — F419 Anxiety disorder, unspecified: Secondary | ICD-10-CM

## 2020-04-17 DIAGNOSIS — J3089 Other allergic rhinitis: Secondary | ICD-10-CM

## 2020-04-17 MED ORDER — SERTRALINE HCL 25 MG PO TABS: 25.0000 mg | ORAL_TABLET | Freq: Every day | ORAL | 1 refills | Status: DC

## 2020-04-17 NOTE — Progress Notes (Signed)
Virtual Visit via Video Note   I connected with [name] on 04/17/20 by a video enabled telemedicine application and verified that I am speaking with the correct person using two identifiers.  Location patient: home Location provider:work office Persons participating in the virtual visit: patient, provider  I discussed the limitations of evaluation and management by telemedicine and the availability of in person appointments. The patient expressed understanding and agreed to proceed.   HPI: [name] is a 54 yo female being seen today for follow up. She was last seen on 03/06/20. Sine her last visit she has seen ENT and her immunologist.  Last visit she was c/o worsening anxiety. Sertraline 25 mg daily. She has tolerated medications well. Anxiety has improved greatly.  She is also starting a new job in a few weeks.  Negative for depressed mood or suicidal thoughts.  She is concerned because for the past 2 days she has had "a little cough." No associated fever, chills, sore throat, wheezing, dyspnea, or CP. She is taking OTC herbal cough drops and drinking water. No sick contact.  Negative for heartburn.  Rhinorrhea, frontal pressure headache first thing in the morning, and postnasal drainage. Intermittent dysphonia in the morning. She is still having earache. According to patient, ENT evaluation was negative.  She received a prescription for eardrops. She was also told she has mild hearing loss.  ROS: See pertinent positives and negatives per HPI.  Past Medical History:  Diagnosis Date  . Allergy   . Arthritis   . Atypical chest pain 10/25/2019  . Diabetes mellitus without complication ([place])    2446  . Essential hypertension 10/25/2019  . Family history of anesthesia complication    "Mama did; she has alzheimer's; any anesthesia puts her in the twilight zone"  . Gallstones   . GERD (gastroesophageal reflux disease)   . Migraine    "cluster migraine"  . Sciatic nerve  injury    "right side"    Past Surgical History:  Procedure Laterality Date  . ANTERIOR CRUCIATE LIGAMENT REPAIR Right 1996  . CHOLECYSTECTOMY N/A 08/31/2014   Procedure: LAPAROSCOPIC CHOLECYSTECTOMY WITH INTRAOPERATIVE CHOLANGIOGRAM;  Surgeon: Greer Pickerel, MD;  Location: [facility];  Service: General;  Laterality: N/A;  . KNEE ARTHROSCOPY Right 1998   with ACL reconstruction  . LAPAROSCOPIC CHOLECYSTECTOMY  08/31/2014  . SINOSCOPY  1998  . TONSILLECTOMY  ~ 14    Family History  Problem Relation Age of Onset  . Dementia Mother   . Stroke Mother   . Diabetes Mother   . Cancer Father        prostate  . Heart attack Father   . Diabetes Daughter   . Hyperlipidemia Daughter   . Hypertension Daughter   . Heart attack Maternal Grandfather   . Allergic rhinitis Neg Hx   . Angioedema Neg Hx   . Asthma Neg Hx   . Atopy Neg Hx   . Eczema Neg Hx   . Immunodeficiency Neg Hx   . Urticaria Neg Hx     Social History   Socioeconomic History  . Marital status: Divorced    Spouse name: Not on file  . Number of children: Not on file  . Years of education: Not on file  . Highest education level: Not on file  Occupational History  . Not on file  Tobacco Use  . Smoking status: Never Smoker  . Smokeless tobacco: Never Used  Substance and Sexual Activity  . Alcohol use: No  .  Drug use: No  . Sexual activity: Yes  Other Topics Concern  . Not on file  Social History Narrative  . Not on file   Social Determinants of Health   Financial Resource Strain:   . Difficulty of Paying Living Expenses:   Food Insecurity:   . Worried About Charity fundraiser in the Last Year:   . Arboriculturist in the Last Year:   Transportation Needs:   . Film/video editor (Medical):   Marland Kitchen Lack of Transportation (Non-Medical):   Physical Activity:   . Days of Exercise per Week:   . Minutes of Exercise per Session:   Stress:   . Feeling of Stress :   Social Connections:   . Frequency of  Communication with Friends and Family:   . Frequency of Social Gatherings with Friends and Family:   . Attends Religious Services:   . Active Member of Clubs or Organizations:   . Attends Archivist Meetings:   Marland Kitchen Marital Status:   Intimate Partner Violence:   . Fear of Current or Ex-Partner:   . Emotionally Abused:   Marland Kitchen Physically Abused:   . Sexually Abused:       Current Outpatient Medications:  .  albuterol (PROAIR HFA) 108 (90 Base) MCG/ACT inhaler, INHALE 2 PUFFS INTO THE LUNGS EVERY 6 HOURS AS NEEDED FOR WHEEZING OR SHORTNESS OF BREATH, Disp: 18 g, Rfl: 2 .  albuterol (PROVENTIL) (2.5 MG/3ML) 0.083% nebulizer solution, Take 3 mLs (2.5 mg total) by nebulization every 6 (six) hours as needed for wheezing or shortness of breath., Disp: 360 mL, Rfl: 1 .  Azelastine HCl 0.15 % SOLN, Use 1-2 sprays per nostril twice a day as needed for drainage., Disp: 30 mL, Rfl: 5 .  Blood Glucose Monitoring Suppl (ACCU-CHEK AVIVA PLUS) w/Device KIT, 1 Device by Does not apply route daily., Disp: 1 kit, Rfl: 0 .  budesonide-formoterol (SYMBICORT) 160-4.5 MCG/ACT inhaler, Inhale 2 puffs into the lungs 2 (two) times daily., Disp: 1 Inhaler, Rfl: 3 .  fluticasone (FLONASE) 50 MCG/ACT nasal spray, Place 2 sprays into both nostrils 2 (two) times a day., Disp: , Rfl:  .  glucose blood (ACCU-CHEK AVIVA PLUS) test strip, 2 times daily, Disp: 180 each, Rfl: 1 .  Lancets (ACCU-CHEK SOFT TOUCH) lancets, 2/day, Disp: 180 each, Rfl: 12 .  Magnesium Gluconate (MAG-G) 500 (27 Mg) MG TABS, Take by mouth., Disp: , Rfl:  .  metFORMIN (GLUCOPHAGE) 500 MG tablet, Take 1 tablet (500 mg total) by mouth 2 (two) times daily with a meal., Disp: 60 tablet, Rfl: 3 .  montelukast (SINGULAIR) 10 MG tablet, Take 1 tablet (10 mg total) by mouth at bedtime., Disp: 30 tablet, Rfl: 3 .  Respiratory Therapy Supplies (ONE FLOW SPIROMETER) DEVI, 1 Device by Does not apply route 3 (three) times daily as needed., Disp: 1 each, Rfl:  0 .  sertraline (ZOLOFT) 25 MG tablet, Take 1 tablet (25 mg total) by mouth daily., Disp: 90 tablet, Rfl: 1 .  SUMAtriptan (IMITREX) 50 MG tablet, Take 50 mg by mouth every 2 (two) hours as needed for migraine. May repeat in 2 hours if headache persists or recurs., Disp: , Rfl:  .  Tiotropium Bromide Monohydrate (SPIRIVA RESPIMAT) 1.25 MCG/ACT AERS, Inhale 2 puffs into the lungs daily., Disp: 4 g, Rfl: 5  EXAM:  VITALS per patient if applicable:BP 093/81   Temp (!) 97.5 F (36.4 C)   Ht '4\' 11"'  (1.499 m)   BMI  36.36 kg/m   GENERAL: alert, oriented, appears well and in no acute distress  HEENT: atraumatic, conjunctiva clear, no obvious abnormalities on inspection.  NECK: normal movements of the head and neck  LUNGS: on inspection no signs of respiratory distress, breathing rate appears normal, no obvious gross SOB, gasping or wheezing  CV: no obvious cyanosis  PSYCH/NEURO: pleasant and cooperative, no obvious depression or anxiety, speech and thought processing grossly intact  ASSESSMENT AND PLAN:  Discussed the following assessment and plan:  Cough We discussed possible etiologies. Monitor for new symptoms. Most likely caused by allergies.  Other allergic rhinitis Nasal irrigation with saline as needed. Continue Singulair 10 mg and Astelin nasal spray. Following with immunologist.  Anxiety disorder, unspecified type - Plan: sertraline (ZOLOFT) 25 MG tablet Improved. Continue Sertraline 25 mg daily. We will re-evaluate in    I discussed the assessment and treatment plan with the patient. [name] was provided an opportunity to ask questions and all were answered. She agreed with the plan and demonstrated an understanding of the instructions.    Return in about 5 months (around 09/17/2020) for Anxiety,DM,HTN.    Jailine Lieder Martinique, MD

## 2020-04-27 ENCOUNTER — Ambulatory Visit: Payer: BC Managed Care – PPO | Admitting: Allergy

## 2020-06-07 NOTE — Progress Notes (Signed)
Follow Up Note  RE: [name] MRN: [id number] DOB: 29-Dec-1965 Date of Office Visit: 06/08/2020  Referring provider: Martinique, Betty G, MD Primary care provider: Martinique, Betty G, MD  Chief Complaint: Reactive Airway Disease  History of Present Illness: I had the pleasure of seeing [name] for a follow up visit at the [facility] on 06/09/2020. She is a 54 y.o. female, who is being followed for reactive airway disease, allergic rhino conjunctivitis, drug allergies and adverse food reaction. Her previous allergy office visit was on 02/26/2020 with Dr. Maudie Mercury. Today is a regular follow up visit.  Reactive airway disease Noted some shortness of breath/chest tightness with raining and the weather change. Using albuterol as needed with some benefit. Currently on Symbicort 160 2 puffs twice a day and Spiriva 1.25 2 puffs once a day. Using albuterol nebulizer twice a day still and did not stop after 2 weeks.  Breathing is 80% improved since the last visit.   Seasonal allergic rhinitis due to pollen Currently on zyrtec 58m once a day but causes grogginess even if she takes it at night.  Flonase 1 spray twice a day with some benefit. Using azelastine as needed. Still taking Singulair daily.  Adverse food reaction Avoiding citrus fruits.   Assessment and Plan: [name] a 54y.o. female with: Reactive airway disease Past history - Patient had covid in January and her breathing is slowly improved. Cardiology work up - (Echo reveals normal systolic function and grade 1 diastolic dysfunction). Interim history - 80% improved with below regimen.  Today's spirometry showed some restriction.  Advised patient to only use nebulizer if needed now and not daily.   Daily controller medication(s):continue with Symbicort 160 to 2 puffs twice a day with spacer and rinse mouth afterwards.  Continue with Spiriva 1.273m 2 puffs once a day.   Continue with Singulair 1053maily.     Prior to physical activity:May use albuterol rescue inhaler 2 puffs 5 to 15 minutes prior to strenuous physical activities.  Rescue medications:May use albuterol rescue inhaler 2 puffs or nebulizer every 4 to 6 hours as needed for shortness of breath, chest tightness, coughing, and wheezing. Monitor frequency of use.  Repeat spirometry at next visit.   Seasonal allergic rhinitis due to pollen Past history - Rhino conjunctivitis symptoms for the past 20 years during the spring through fall. Tried Claritin-D, Singulair and Flonase with some benefit. 2020 skin testing showed: positive to grass pollen only. Ryvent and zyrtec caused grogginess.  Interim history - stable.   May use azelastine nasal spray 1-2 sprays per nostril twice a day as needed for drainage.   Continue environmental control measures.  Continue Flonase 1 spray twice a day.  Nasal saline spray (i.e., Simply Saline) or nasal saline lavage (i.e., NeilMed) is recommended as needed and prior to medicated nasal sprays.  May use over the counter antihistamines such as Claritin (loratadine), Allegra (fexofenadine) daily as needed and may take twice a day if needed.   Allergic conjunctivitis of both eyes  See assessment and plan as above allergic rhinitis.  Adverse food reaction Past history - Breaks out in hives after eating citrus fruits.  Continue to avoid citrus fruits.   Drug reaction Past history - Hives with penicillin (over 10 years ago) and Vicodin.   Continue to avoid for now. Consider penicillin testing in future.   Return in about 3 months (around 09/08/2020).  Meds ordered this encounter  Medications  .  Tiotropium Bromide Monohydrate (SPIRIVA RESPIMAT) 1.25 MCG/ACT AERS    Sig: Inhale 2 puffs into the lungs daily.    Dispense:  4 g    Refill:  5  . budesonide-formoterol (SYMBICORT) 160-4.5 MCG/ACT inhaler    Sig: Inhale 2 puffs into the lungs 2 (two) times daily. with spacer and rinse mouth  afterwards.    Dispense:  1 Inhaler    Refill:  5   Diagnostics: Spirometry:  Tracings reviewed. Her effort: Good reproducible efforts. FVC: 1.86L FEV1: 1.59L, 71% predicted FEV1/FVC ratio: 85% Interpretation: Spirometry consistent with possible restrictive disease.  Please see scanned spirometry results for details.  Medication List:  Current Outpatient Medications  Medication Sig Dispense Refill  . albuterol (PROAIR HFA) 108 (90 Base) MCG/ACT inhaler INHALE 2 PUFFS INTO THE LUNGS EVERY 6 HOURS AS NEEDED FOR WHEEZING OR SHORTNESS OF BREATH 18 g 2  . albuterol (PROVENTIL) (2.5 MG/3ML) 0.083% nebulizer solution Take 3 mLs (2.5 mg total) by nebulization every 6 (six) hours as needed for wheezing or shortness of breath. 360 mL 1  . Azelastine HCl 0.15 % SOLN Use 1-2 sprays per nostril twice a day as needed for drainage. 30 mL 5  . Blood Glucose Monitoring Suppl (ACCU-CHEK AVIVA PLUS) w/Device KIT 1 Device by Does not apply route daily. 1 kit 0  . budesonide-formoterol (SYMBICORT) 160-4.5 MCG/ACT inhaler Inhale 2 puffs into the lungs 2 (two) times daily. with spacer and rinse mouth afterwards. 1 Inhaler 5  . fluticasone (FLONASE) 50 MCG/ACT nasal spray Place 2 sprays into both nostrils 2 (two) times a day.    Marland Kitchen glucose blood (ACCU-CHEK AVIVA PLUS) test strip 2 times daily 180 each 1  . Lancets (ACCU-CHEK SOFT TOUCH) lancets 2/day 180 each 12  . Magnesium Gluconate (MAG-G) 500 (27 Mg) MG TABS Take by mouth.    . metFORMIN (GLUCOPHAGE) 500 MG tablet Take 1 tablet (500 mg total) by mouth 2 (two) times daily with a meal. 60 tablet 3  . montelukast (SINGULAIR) 10 MG tablet Take 1 tablet (10 mg total) by mouth at bedtime. 30 tablet 3  . Respiratory Therapy Supplies (ONE FLOW SPIROMETER) DEVI 1 Device by Does not apply route 3 (three) times daily as needed. 1 each 0  . sertraline (ZOLOFT) 25 MG tablet Take 1 tablet (25 mg total) by mouth daily. 90 tablet 1  . SUMAtriptan (IMITREX) 50 MG tablet Take  50 mg by mouth every 2 (two) hours as needed for migraine. May repeat in 2 hours if headache persists or recurs.    . Tiotropium Bromide Monohydrate (SPIRIVA RESPIMAT) 1.25 MCG/ACT AERS Inhale 2 puffs into the lungs daily. 4 g 5   No current facility-administered medications for this visit.   Allergies: Allergies  Allergen Reactions  . Citrus Hives  . Penicillins Hives  . Topiramate   . Vicodin [Hydrocodone-Acetaminophen] Hives   I reviewed her past medical history, social history, family history, and environmental history and no significant changes have been reported from her previous visit.  Review of Systems  Constitutional: Negative for appetite change, chills, fever and unexpected weight change.  HENT: Positive for congestion. Negative for postnasal drip and rhinorrhea.   Eyes: Negative for itching.  Respiratory: Positive for chest tightness and shortness of breath. Negative for cough and wheezing.   Cardiovascular: Negative for chest pain.  Gastrointestinal: Negative for abdominal pain.  Genitourinary: Negative for difficulty urinating.  Skin: Negative for rash.  Allergic/Immunologic: Positive for environmental allergies. Negative for food allergies.  Objective: BP 126/82   Pulse 60   Temp 98.3 F (36.8 C) (Temporal)   Resp 16   SpO2 98%  There is no height or weight on file to calculate BMI. Physical Exam Vitals and nursing note reviewed.  Constitutional:      Appearance: She is well-developed.  HENT:     Head: Normocephalic and atraumatic.     Right Ear: External ear normal.     Left Ear: External ear normal.     Nose: Nose normal.  Eyes:     Conjunctiva/sclera: Conjunctivae normal.  Cardiovascular:     Rate and Rhythm: Normal rate and regular rhythm.     Heart sounds: Normal heart sounds. No murmur heard.  No friction rub. No gallop.   Pulmonary:     Effort: Pulmonary effort is normal.     Breath sounds: Normal breath sounds. No wheezing or rales.   Abdominal:     Palpations: Abdomen is soft.  Musculoskeletal:     Cervical back: Neck supple.  Skin:    General: Skin is warm.     Findings: No rash.  Neurological:     Mental Status: She is alert and oriented to person, place, and time.  Psychiatric:        Behavior: Behavior normal.    Previous notes and tests were reviewed. The plan was reviewed with the patient/family, and all questions/concerned were addressed.  It was my pleasure to see [name] today and participate in her care. Please feel free to contact me with any questions or concerns.  Sincerely,  Rexene Alberts, DO Allergy & Immunology  [facility] office: [phone] [facility] office: [place] office: [phone]

## 2020-06-08 ENCOUNTER — Other Ambulatory Visit: Payer: Self-pay

## 2020-06-08 ENCOUNTER — Ambulatory Visit: Payer: 59 | Admitting: Allergy

## 2020-06-08 ENCOUNTER — Encounter: Payer: Self-pay | Admitting: Allergy

## 2020-06-08 VITALS — BP 126/82 | HR 60 | Temp 98.3°F | Resp 16

## 2020-06-08 DIAGNOSIS — J45909 Unspecified asthma, uncomplicated: Secondary | ICD-10-CM

## 2020-06-08 DIAGNOSIS — J301 Allergic rhinitis due to pollen: Secondary | ICD-10-CM

## 2020-06-08 DIAGNOSIS — T50905D Adverse effect of unspecified drugs, medicaments and biological substances, subsequent encounter: Secondary | ICD-10-CM

## 2020-06-08 DIAGNOSIS — J4540 Moderate persistent asthma, uncomplicated: Secondary | ICD-10-CM

## 2020-06-08 DIAGNOSIS — H1013 Acute atopic conjunctivitis, bilateral: Secondary | ICD-10-CM | POA: Diagnosis not present

## 2020-06-08 DIAGNOSIS — T781XXD Other adverse food reactions, not elsewhere classified, subsequent encounter: Secondary | ICD-10-CM

## 2020-06-08 MED ORDER — SPIRIVA RESPIMAT 1.25 MCG/ACT IN AERS: 2.0000 | INHALATION_SPRAY | Freq: Every day | RESPIRATORY_TRACT | 5 refills | Status: DC

## 2020-06-08 MED ORDER — BUDESONIDE-FORMOTEROL FUMARATE 160-4.5 MCG/ACT IN AERO: 2.0000 | INHALATION_SPRAY | Freq: Two times a day (BID) | RESPIRATORY_TRACT | 5 refills | Status: DC

## 2020-06-08 NOTE — Patient Instructions (Addendum)
Reactive airway disease  Daily controller medication(s):continue with Symbicort 160 to 2 puffs twice a day with spacer and rinse mouth afterwards.  Continue with spiriva 1.57mcg 2 puffs once a day.   Continue with Singulair 10mg  daily.   Prior to physical activity:May use albuterol rescue inhaler 2 puffs 5 to 15 minutes prior to strenuous physical activities.  Rescue medications:May use albuterol rescue inhaler 2 puffs or nebulizer every 4 to 6 hours as needed for shortness of breath, chest tightness, coughing, and wheezing. Monitor frequency of use. Asthma control goals:  Full participation in all desired activities (may need albuterol before activity) Albuterol use two times or less a week on average (not counting use with activity) Cough interfering with sleep two times or less a month Oral steroids no more than once a year No hospitalizations  Seasonal allergic rhinitis due to pollen 2020 skin testing showed: positive to grass pollen only.  May use azelastine nasal spray 1-2 sprays per nostril twice a day as needed for drainage.   Continue environmental control measures.  Continue Flonase 1 spray twice a day.  Nasal saline spray (i.e., Simply Saline) or nasal saline lavage (i.e., NeilMed) is recommended as needed and prior to medicated nasal sprays.  May use over the counter antihistamines such as Claritin (loratadine), Allegra (fexofenadine) daily as needed and may take twice a day if needed.   Drug reaction Past history - Hives with penicillin (over 10 years ago) and Vicodin.   Continue to avoid for now. Consider penicillin testing in future.   Adverse food reaction  Continue to avoid citrus fruits.    Follow up in 3 months or sooner if needed.

## 2020-06-09 ENCOUNTER — Encounter: Payer: Self-pay | Admitting: Allergy

## 2020-06-09 NOTE — Assessment & Plan Note (Signed)
   See assessment and plan as above allergic rhinitis. 

## 2020-06-09 NOTE — Assessment & Plan Note (Signed)
Past history - Hives with penicillin (over 10 years ago) and Vicodin.  °· Continue to avoid for now. Consider penicillin testing in future.  °

## 2020-06-09 NOTE — Assessment & Plan Note (Signed)
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 Tried Claritin-D, Singulair and Flonase with some benefit. 2020 skin testing showed: positive to grass pollen only. Ryvent and zyrtec caused grogginess.  Interim history - stable.   May use azelastine nasal spray 1-2 sprays per nostril twice a day as needed for drainage.   Continue environmental control measures.  Continue Flonase 1 spray twice a day.  Nasal saline spray (i.e., Simply Saline) or nasal saline lavage (i.e., NeilMed) is recommended as needed and prior to medicated nasal sprays.  May use over the counter antihistamines such as Claritin (loratadine), Allegra (fexofenadine) daily as needed and may take twice a day if needed.

## 2020-06-09 NOTE — Assessment & Plan Note (Signed)
Past history - Patient had covid in January and her breathing is slowly improved. Cardiology work up - (Echo reveals normal systolic function and grade 1 diastolic dysfunction). Interim history - 80% improved with below regimen.  Today's spirometry showed some restriction.  Advised patient to only use nebulizer if needed now and not daily.   Daily controller medication(s):continue with Symbicort 160 to 2 puffs twice a day with spacer and rinse mouth afterwards.  Continue with Spiriva 1.59mcg 2 puffs once a day.   Continue with Singulair 10mg  daily.   Prior to physical activity:May use albuterol rescue inhaler 2 puffs 5 to 15 minutes prior to strenuous physical activities.  Rescue medications:May use albuterol rescue inhaler 2 puffs or nebulizer every 4 to 6 hours as needed for shortness of breath, chest tightness, coughing, and wheezing. Monitor frequency of use.  Repeat spirometry at next visit.

## 2020-06-09 NOTE — Assessment & Plan Note (Signed)
Past history - Breaks out in hives after eating citrus fruits.  Continue to avoid citrus fruits.  

## 2020-07-13 ENCOUNTER — Telehealth: Payer: Self-pay | Admitting: Allergy

## 2020-07-13 DIAGNOSIS — J45909 Unspecified asthma, uncomplicated: Secondary | ICD-10-CM

## 2020-07-13 NOTE — Telephone Encounter (Signed)
Patient called and states her insurance changed to [facility] and they do not [employer]. Patient would like an alternative sent in to [facility] on [address].  Please advise.

## 2020-07-13 NOTE — Telephone Encounter (Signed)
Patient called and states she talked to [facility] and they will not cover anything similar to Symbicort, but they do cover Spiriva and albuterol.

## 2020-07-14 MED ORDER — SYMBICORT 160-4.5 MCG/ACT IN AERO
2.0000 | INHALATION_SPRAY | Freq: Two times a day (BID) | RESPIRATORY_TRACT | 5 refills | Status: DC
Start: 2020-07-14 — End: 2020-12-30

## 2020-07-14 NOTE — Telephone Encounter (Signed)
Left a message for patient to call back. 

## 2020-07-14 NOTE — Addendum Note (Signed)
Addended by: Mliss Fritz I on: 07/14/2020 03:53 PM   Modules accepted: Orders

## 2020-07-14 NOTE — Telephone Encounter (Signed)
Prescription for brand name only Symbicort 160 sent to pharmacy. I have left a detailed message advising patient of this as well.

## 2020-07-14 NOTE — Telephone Encounter (Signed)
Patient called stating her insurance requires the brand name SYMBICORT instead of the generic. Can we send this in as the brand name please?  Thanks

## 2020-07-14 NOTE — Telephone Encounter (Signed)
Please call patient.  Insurance has to cover at least one inhaler in each different classes of inhalers.  These are the options other than Symbicort that she should check with her insurance as we can't check for her how much it would be.   Elwin Sleight, Advair HFA or Diskus, Airduo and Breo.

## 2020-08-12 ENCOUNTER — Telehealth (INDEPENDENT_AMBULATORY_CARE_PROVIDER_SITE_OTHER): Payer: Self-pay | Admitting: Family Medicine

## 2020-08-12 ENCOUNTER — Encounter: Payer: Self-pay | Admitting: Family Medicine

## 2020-08-12 VITALS — BP 118/78 | HR 78

## 2020-08-12 DIAGNOSIS — F419 Anxiety disorder, unspecified: Secondary | ICD-10-CM

## 2020-08-12 DIAGNOSIS — E669 Obesity, unspecified: Secondary | ICD-10-CM

## 2020-08-12 DIAGNOSIS — R42 Dizziness and giddiness: Secondary | ICD-10-CM

## 2020-08-12 DIAGNOSIS — E1169 Type 2 diabetes mellitus with other specified complication: Secondary | ICD-10-CM

## 2020-08-12 DIAGNOSIS — I10 Essential (primary) hypertension: Secondary | ICD-10-CM

## 2020-08-12 DIAGNOSIS — H9312 Tinnitus, left ear: Secondary | ICD-10-CM

## 2020-08-12 MED ORDER — MECLIZINE HCL 25 MG PO TABS: 25.0000 mg | ORAL_TABLET | Freq: Three times a day (TID) | ORAL | 0 refills | Status: AC | PRN

## 2020-08-12 MED ORDER — SERTRALINE HCL 25 MG PO TABS: 25.0000 mg | ORAL_TABLET | Freq: Every day | ORAL | 1 refills | Status: DC

## 2020-08-12 MED ORDER — MECLIZINE HCL 25 MG PO TABS: 25.0000 mg | ORAL_TABLET | Freq: Three times a day (TID) | ORAL | 0 refills | Status: DC | PRN

## 2020-08-12 NOTE — Progress Notes (Signed)
Virtual Visit via Video Note   I connected with [name] on 08/12/20 by a video enabled telemedicine application and verified that I am speaking with the correct person using two identifiers.  Location patient: home Location provider:work or home office Persons participating in the virtual visit: patient, provider  I discussed the limitations of evaluation and management by telemedicine and the availability of in person appointments. The patient expressed understanding and agreed to proceed.  Chief Complaint  Patient presents with   Follow-up   Ear Pain    HPI: [name] is a 54 yo female being seen today for chronic disease management. She was last seen on 04/17/20.  DM II: BS's low 100's. She lost 18 Lb. Walking around her house , 5000 steps daily. She is trying to eat better.  She stopped Metformin after a couple weeks of starting it, she ahs GI side effects. Negative for abdominal pain, vomiting, polydipsia,polyuria, or polyphagia.  Lab Results  Component Value Date   HGBA1C 8.8 (H) 03/06/2020   HTN: She is on non pharmacologic treatment.110's/70'. Negative visual changes, chest pain, dyspnea, palpitation, focal weakness, or edema.  Lab Results  Component Value Date   CREATININE 0.82 02/12/2020   BUN 11 02/12/2020   NA 140 02/12/2020   K 3.9 02/12/2020   CL 102 02/12/2020   CO2 30 02/12/2020   Left ear "ringing", she has had intermittent for a while.She thinks she has mentioned this to ENT. Problem is very annoyed and she wants this to "go away."  "Spinning" sensation in the morning and sometimes when she is in bed. Associated frontal pressure and nausea. No abdominal pain or vomiting. Episodes last about 20 min. No other associated symptom.  She follows with Dr Lucia Gaskins ,ENT.  According to pt, she was told to follow if needed.  Yesterday she had "excruciating" pain around TMJ area,problem has resolved. Pain was not exacerbated by chewing or mouth  opening. Tender with palpation. She took  Advil x 2.  Negative for fever,chills, body aches,unusual fatigue,visual changes,sore throat,hearing changes,ear drainage,edema,erythema,or skin rash.  Anxiety: She is on Sertraline 25 mg, which was added on 03/06/20. Problem was exacerbated by COVID 19 infection. She has tolerated medication well and she feels like it is helping. Negative for depressed mood.  ROS: See pertinent positives and negatives per HPI.  Past Medical History:  Diagnosis Date   Allergy    Arthritis    Atypical chest pain 10/25/2019   Diabetes mellitus without complication ([place])    1610   Essential hypertension 10/25/2019   Family history of anesthesia complication    "Mama did; she has alzheimer's; any anesthesia puts her in the twilight zone"   Gallstones    GERD (gastroesophageal reflux disease)    Migraine    "cluster migraine"   Sciatic nerve injury    "right side"    Past Surgical History:  Procedure Laterality Date   ANTERIOR CRUCIATE LIGAMENT REPAIR Right 1996   CHOLECYSTECTOMY N/A 08/31/2014   Procedure: LAPAROSCOPIC CHOLECYSTECTOMY WITH INTRAOPERATIVE CHOLANGIOGRAM;  Surgeon: Greer Pickerel, MD;  Location: [facility];  Service: General;  Laterality: N/A;   KNEE ARTHROSCOPY Right 1998   with ACL reconstruction   LAPAROSCOPIC CHOLECYSTECTOMY  08/31/2014   SINOSCOPY  1998   TONSILLECTOMY  ~ 1978    Family History  Problem Relation Age of Onset   Dementia Mother    Stroke Mother    Diabetes Mother    Cancer Father  prostate   Heart attack Father    Diabetes Daughter    Hyperlipidemia Daughter    Hypertension Daughter    Heart attack Maternal Grandfather    Allergic rhinitis Neg Hx    Angioedema Neg Hx    Asthma Neg Hx    Atopy Neg Hx    Eczema Neg Hx    Immunodeficiency Neg Hx    Urticaria Neg Hx     Social History   Socioeconomic History   Marital status: Divorced    Spouse name: Not on file   Number  of children: Not on file   Years of education: Not on file   Highest education level: Not on file  Occupational History   Not on file  Tobacco Use   Smoking status: Never Smoker   Smokeless tobacco: Never Used  Vaping Use   Vaping Use: Never used  Substance and Sexual Activity   Alcohol use: No   Drug use: No   Sexual activity: Yes  Other Topics Concern   Not on file  Social History Narrative   Not on file   Social Determinants of Health   Financial Resource Strain:    Difficulty of Paying Living Expenses: Not on file  Food Insecurity:    Worried About Running Out of Food in the Last Year: Not on file   Ran Out of Food in the Last Year: Not on file  Transportation Needs:    Lack of Transportation (Medical): Not on file   Lack of Transportation (Non-Medical): Not on file  Physical Activity:    Days of Exercise per Week: Not on file   Minutes of Exercise per Session: Not on file  Stress:    Feeling of Stress : Not on file  Social Connections:    Frequency of Communication with Friends and Family: Not on file   Frequency of Social Gatherings with Friends and Family: Not on file   Attends Religious Services: Not on file   Active Member of Clubs or Organizations: Not on file   Attends Archivist Meetings: Not on file   Marital Status: Not on file  Intimate Partner Violence:    Fear of Current or Ex-Partner: Not on file   Emotionally Abused: Not on file   Physically Abused: Not on file   Sexually Abused: Not on file    Current Outpatient Medications:    albuterol (PROAIR HFA) 108 (90 Base) MCG/ACT inhaler, INHALE 2 PUFFS INTO THE LUNGS EVERY 6 HOURS AS NEEDED FOR WHEEZING OR SHORTNESS OF BREATH, Disp: 18 g, Rfl: 2   albuterol (PROVENTIL) (2.5 MG/3ML) 0.083% nebulizer solution, Take 3 mLs (2.5 mg total) by nebulization every 6 (six) hours as needed for wheezing or shortness of breath., Disp: 360 mL, Rfl: 1   Azelastine HCl 0.15 %  SOLN, Use 1-2 sprays per nostril twice a day as needed for drainage., Disp: 30 mL, Rfl: 5   Blood Glucose Monitoring Suppl (ACCU-CHEK AVIVA PLUS) w/Device KIT, 1 Device by Does not apply route daily., Disp: 1 kit, Rfl: 0   fluticasone (FLONASE) 50 MCG/ACT nasal spray, Place 2 sprays into both nostrils 2 (two) times a day., Disp: , Rfl:    glucose blood (ACCU-CHEK AVIVA PLUS) test strip, 2 times daily, Disp: 180 each, Rfl: 1   Lancets (ACCU-CHEK SOFT TOUCH) lancets, 2/day, Disp: 180 each, Rfl: 12   Magnesium Gluconate (MAG-G) 500 (27 Mg) MG TABS, Take by mouth., Disp: , Rfl:    montelukast (SINGULAIR) 10 MG  tablet, Take 1 tablet (10 mg total) by mouth at bedtime., Disp: 30 tablet, Rfl: 3   Respiratory Therapy Supplies (ONE FLOW SPIROMETER) DEVI, 1 Device by Does not apply route 3 (three) times daily as needed., Disp: 1 each, Rfl: 0   sertraline (ZOLOFT) 25 MG tablet, Take 1 tablet (25 mg total) by mouth daily., Disp: 90 tablet, Rfl: 1   SUMAtriptan (IMITREX) 50 MG tablet, Take 50 mg by mouth every 2 (two) hours as needed for migraine. May repeat in 2 hours if headache persists or recurs., Disp: , Rfl:    SYMBICORT 160-4.5 MCG/ACT inhaler, Inhale 2 puffs into the lungs 2 (two) times daily. With spacer. Rinse mouth afterwards., Disp: 1 Inhaler, Rfl: 5   Tiotropium Bromide Monohydrate (SPIRIVA RESPIMAT) 1.25 MCG/ACT AERS, Inhale 2 puffs into the lungs daily., Disp: 4 g, Rfl: 5   meclizine (ANTIVERT) 25 MG tablet, Take 1 tablet (25 mg total) by mouth 3 (three) times daily as needed for up to 10 days for dizziness., Disp: 30 tablet, Rfl: 0  EXAM:  VITALS per patient if applicable:BP 379/44    Pulse 78    SpO2 98%   GENERAL: alert, oriented, appears well and in no acute distress  HEENT: atraumatic, conjunctiva clear, no obvious abnormalities on inspection of external nose and ears  NECK: normal movements of the head and neck  LUNGS: on inspection no signs of respiratory distress,  breathing rate appears normal, no obvious gross SOB, gasping or wheezing  CV: no obvious cyanosis  Holly: moves all visible extremities without noticeable abnormality  PSYCH/NEURO: pleasant and cooperative, no obvious depression or anxiety, speech and thought processing grossly intact  ASSESSMENT AND PLAN:  Discussed the following assessment and plan:  Orders Placed This Encounter  Procedures   Basic metabolic panel   Fructosamine   Hemoglobin A1c   Lipid panel   Microalbumin / creatinine urine ratio    Tinnitus of left ear We discussed Dx and prognosis. Explained that we do not have effective treatment, most of the time benign. Recommend arranging appt with ENT if problem gets worse or to discuss options.  Diabetes mellitus type 2 in obese (HCC)  Problem has not been well controlled. Wt loss through a healthful diet and physical activity definitively helps. No changes in current management. Adequate foot care and periodic eye exam. We will arrange lab appt.  Vertigo  Hx suggest positional vertigo. She agrees with trying Meclizine. Some side effects discussed. She refers to hold on vestibular therapy for now. If suddenly worse she needs to seek immediate medical attention.  - Plan: meclizine (ANTIVERT) 25 MG tablet, DISCONTINUED: meclizine (ANTIVERT) 25 MG tablet  Essential hypertension BP adequately controlled. Continue non pharmacologic treatment.  Anxiety disorder, unspecified type  Adequately controlled. Continue Sertraline 25 mg daily.  - Plan: sertraline (ZOLOFT) 25 MG tablet  I discussed the assessment and treatment plan with the patient. [name] was provided an opportunity to ask questions and all were answered.She agreed with the plan and demonstrated an understanding of the instructions.    Return in about 5 months (around 01/12/2021) for Labs next week..   Juanice Warburton Martinique, MD

## 2020-08-24 ENCOUNTER — Other Ambulatory Visit: Payer: Self-pay

## 2020-08-24 ENCOUNTER — Other Ambulatory Visit: Payer: BC Managed Care – PPO

## 2020-08-24 DIAGNOSIS — E1169 Type 2 diabetes mellitus with other specified complication: Secondary | ICD-10-CM

## 2020-08-25 LAB — MICROALBUMIN / CREATININE URINE RATIO
Creatinine, Urine: 111 mg/dL (ref 20–275)
Microalb Creat Ratio: 5 mcg/mg creat (ref <30)
Microalb, Ur: 0.6 mg/dL

## 2020-08-26 LAB — LIPID PANEL
Cholesterol: 268 mg/dL — ABNORMAL HIGH (ref <200)
HDL: 53 mg/dL (ref >=50)
LDL Cholesterol (Calc): 188 mg/dL (calc) — ABNORMAL HIGH
Non-HDL Cholesterol (Calc): 215 mg/dL (calc) — ABNORMAL HIGH (ref <130)
Total CHOL/HDL Ratio: 5.1 (calc) — ABNORMAL HIGH (ref <5.0)
Triglycerides: 135 mg/dL (ref <150)

## 2020-08-26 LAB — HEMOGLOBIN A1C
Hgb A1c MFr Bld: 8.2 % of total Hgb — ABNORMAL HIGH (ref <5.7)
Mean Plasma Glucose: 189 (calc)
eAG (mmol/L): 10.4 (calc)

## 2020-08-26 LAB — BASIC METABOLIC PANEL
BUN: 15 mg/dL (ref 7–25)
CO2: 26 mmol/L (ref 20–32)
Calcium: 9.9 mg/dL (ref 8.6–10.4)
Chloride: 105 mmol/L (ref 98–110)
Creat: 0.88 mg/dL (ref 0.50–1.05)
Glucose, Bld: 165 mg/dL — ABNORMAL HIGH (ref 65–99)
Potassium: 4.2 mmol/L (ref 3.5–5.3)
Sodium: 140 mmol/L (ref 135–146)

## 2020-08-26 LAB — FRUCTOSAMINE: Fructosamine: 279 umol/L (ref 205–285)

## 2020-09-08 ENCOUNTER — Encounter: Payer: Self-pay | Admitting: Family Medicine

## 2020-09-08 DIAGNOSIS — E785 Hyperlipidemia, unspecified: Secondary | ICD-10-CM | POA: Insufficient documentation

## 2020-09-13 ENCOUNTER — Other Ambulatory Visit: Payer: Self-pay | Admitting: Family Medicine

## 2020-09-13 DIAGNOSIS — J45909 Unspecified asthma, uncomplicated: Secondary | ICD-10-CM

## 2020-09-14 ENCOUNTER — Ambulatory Visit: Payer: 59 | Admitting: Allergy

## 2020-09-18 ENCOUNTER — Other Ambulatory Visit: Payer: Self-pay

## 2020-09-18 ENCOUNTER — Telehealth: Payer: BC Managed Care – PPO | Admitting: Family Medicine

## 2020-09-18 ENCOUNTER — Telehealth: Payer: Self-pay

## 2020-09-18 ENCOUNTER — Ambulatory Visit: Payer: 59 | Admitting: Allergy

## 2020-09-18 ENCOUNTER — Encounter: Payer: Self-pay | Admitting: Allergy

## 2020-09-18 VITALS — BP 130/76 | HR 68 | Temp 98.0°F | Resp 20 | Ht 60.0 in | Wt 181.4 lb

## 2020-09-18 DIAGNOSIS — J301 Allergic rhinitis due to pollen: Secondary | ICD-10-CM

## 2020-09-18 DIAGNOSIS — J4540 Moderate persistent asthma, uncomplicated: Secondary | ICD-10-CM

## 2020-09-18 DIAGNOSIS — T63441A Toxic effect of venom of bees, accidental (unintentional), initial encounter: Secondary | ICD-10-CM | POA: Insufficient documentation

## 2020-09-18 DIAGNOSIS — H1013 Acute atopic conjunctivitis, bilateral: Secondary | ICD-10-CM | POA: Diagnosis not present

## 2020-09-18 DIAGNOSIS — T781XXD Other adverse food reactions, not elsewhere classified, subsequent encounter: Secondary | ICD-10-CM

## 2020-09-18 DIAGNOSIS — T50905D Adverse effect of unspecified drugs, medicaments and biological substances, subsequent encounter: Secondary | ICD-10-CM

## 2020-09-18 DIAGNOSIS — T63441D Toxic effect of venom of bees, accidental (unintentional), subsequent encounter: Secondary | ICD-10-CM

## 2020-09-18 MED ORDER — PREDNISONE 10 MG PO TABS: ORAL_TABLET | ORAL | 0 refills | Status: DC

## 2020-09-18 MED ORDER — SULFAMETHOXAZOLE-TRIMETHOPRIM 800-160 MG PO TABS: 1.0000 | ORAL_TABLET | Freq: Two times a day (BID) | ORAL | 0 refills | Status: DC

## 2020-09-18 NOTE — Telephone Encounter (Signed)
Called and spoke with the patient and she is more than happy to come in. She has been scheduled to come see you today in the [facility] office at 11:30 and I provided her with the address.

## 2020-09-18 NOTE — Assessment & Plan Note (Signed)
Past history - Patient had covid in January and her breathing is slowly improved. Cardiology work up - (Echo reveals normal systolic function and grade 1 diastolic dysfunction). Interim history - doing much better.   Today's spirometry was normal.  ACT score 25.  Daily controller medication(s):continue with Symbicort 160 to 2 puffs twice a day with spacer and rinse mouth afterwards.  STOP Spiriva 1.73mcg for now.   Continue with Singulair 10mg  daily.   Prior to physical activity:May use albuterol rescue inhaler 2 puffs 5 to 15 minutes prior to strenuous physical activities.  Rescue medications:May use albuterol rescue inhaler 2 puffs or nebulizer every 4 to 6 hours as needed for shortness of breath, chest tightness, coughing, and wheezing. Monitor frequency of use.  Repeat spirometry at next visit.

## 2020-09-18 NOTE — Assessment & Plan Note (Signed)
Past history - Hives with penicillin (over 10 years ago) and Vicodin.   Continue to avoid for now. Consider penicillin testing in future.

## 2020-09-18 NOTE — Telephone Encounter (Signed)
Patient called stating she got stung by a bee in her jawline twice on Wednesday. She states she still has swelling and is radiating to her neck/throat. She states the area is very itchy and has redness all the way down to her chest. She has only taken benadryl but it's not helping. She wanted to know if she could be seen today or what you recommend for her to do. She denies any shortness of breath or difficulty swallowing. Please advice.

## 2020-09-18 NOTE — Assessment & Plan Note (Addendum)
Persistent and worsening large localized reaction after a possibly yellow jacket sting. Area is warm to the touch. No other symptoms. History of previous stings with only localized reactions.   Clinical symptoms concerning for cellulitis.  Take additional zyrtec 10mg  at night.  Start prednisone taper - reviewed side effects.  Take prednisone 30mg  twice daily x 5 days then stop.   Start bactrim antibiotics 1 tablet twice a day for 7 days.  Take pictures if it gets worse.   Get tryptase and hymenoptera panel at next visit.   For mild symptoms you can take over the counter antihistamines such as Benadryl and monitor symptoms closely. If symptoms worsen or if you have severe symptoms including breathing issues, throat closure, significant swelling, whole body hives, severe diarrhea and vomiting, lightheadedness then seek immediate medical care.

## 2020-09-18 NOTE — Assessment & Plan Note (Signed)
Past history - Breaks out in hives after eating citrus fruits.  Continue to avoid citrus fruits.

## 2020-09-18 NOTE — Telephone Encounter (Signed)
Can she come to [facility] office today? I have opening at 11:30AM for in person.  If she can't do it in person, then have her schedule for a televisit and send me pictures of the rash.  Thank you.

## 2020-09-18 NOTE — Assessment & Plan Note (Signed)
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 Tried Claritin-D, Singulair and Flonase with some benefit. 2020 skin testing showed: positive to grass pollen only. Ryvent and zyrtec caused grogginess.  Interim history - stable with below regimen.  May use azelastine nasal spray 1-2 sprays per nostril twice a day as needed for drainage.   Continue environmental control measures.  Continue Flonase 1 spray twice a day.  Nasal saline spray (i.e., Simply Saline) or nasal saline lavage (i.e., NeilMed) is recommended as needed and prior to medicated nasal sprays.  May use over the counter antihistamines such as Claritin (loratadine), Allegra (fexofenadine) daily as needed and may take twice a day if needed.

## 2020-09-18 NOTE — Progress Notes (Signed)
Follow Up Note  RE: [name] MRN: [id number] DOB: 1966-11-20 Date of Office Visit: 09/18/2020  Referring provider: Martinique, Betty G, MD Primary care provider: Martinique, Betty G, MD  Chief Complaint: Insect Bite (was stung 09/16/2020. face swollen) and Asthma  History of Present Illness: I had the pleasure of seeing [name] for a follow up visit at the [facility] [place] [facility] on 09/18/2020. She is a 54 y.o. female, who is being followed for reactive airway disease, allergic rhino conjunctivitis, adverse food reaction and drug reaction. Her previous allergy office visit was on 06/08/2020 with Dr. Maudie Mercury. Today is a new complaint visit of bee sting reaction.  Patient got stung on Wednesday twice by a yellow jacket on her facet. She started having issues with itching and swelling. No respiratory compromise.   Patient has been taking benadryl with no benefit. Swelling and itching is persistent and it's getting worse and larger.  No fevers or chills.   She also got stung on her calf in June with large localized reaction.  No prior issues like this previously.   Reactive airway disease ACT score 25 Breathing is doing better. Uses albuterol prior to exertion. Currently using Symbicort 1103mg 2 puffs twice a day, Spiriva 2 puffs daily and Singulair. Denies any SOB, coughing, wheezing, chest tightness, nocturnal awakenings, ER/urgent care visits or prednisone use since the last visit.  Allergic rhino conjunctivitis Taking Flonase daily and allegra daily with good benefit. No nosebleeds.  Assessment and Plan: LJarelisis a 54y.o. female with: Bee sting reaction Persistent and worsening large localized reaction after a possibly yellow jacket sting. Area is warm to the touch. No other symptoms. History of previous stings with only localized reactions.   Clinical symptoms concerning for cellulitis.  Take additional zyrtec 171mat night.  Start prednisone taper - reviewed side  effects.  Take prednisone 3049mwice daily x 5 days then stop.   Start bactrim antibiotics 1 tablet twice a day for 7 days.  Take pictures if it gets worse.   Get tryptase and hymenoptera panel at next visit.   For mild symptoms you can take over the counter antihistamines such as Benadryl and monitor symptoms closely. If symptoms worsen or if you have severe symptoms including breathing issues, throat closure, significant swelling, whole body hives, severe diarrhea and vomiting, lightheadedness then seek immediate medical care.  Reactive airway disease Past history - Patient had covid in January and her breathing is slowly improved. Cardiology work up - (Echo reveals normal systolic function and grade 1 diastolic dysfunction). Interim history - doing much better.   Today's spirometry was normal.  ACT score 25.  Daily controller medication(s):continue with Symbicort 160 to 2 puffs twice a day with spacer and rinse mouth afterwards.  STOP Spiriva 1.52m27mor now.   Continue with Singulair 10mg16mly.   Prior to physical activity:May use albuterol rescue inhaler 2 puffs 5 to 15 minutes prior to strenuous physical activities.  Rescue medications:May use albuterol rescue inhaler 2 puffs or nebulizer every 4 to 6 hours as needed for shortness of breath, chest tightness, coughing, and wheezing. Monitor frequency of use.  Repeat spirometry at next visit.   Seasonal allergic rhinitis due to pollen Past history - Rhino conjunctivitis symptoms for the past 20 years during the spring through fall. Tried Claritin-D, Singulair and Flonase with some benefit. 2020 skin testing showed: positive to grass pollen only. Ryvent and zyrtec caused grogginess.  Interim history - stable with  below regimen.  May use azelastine nasal spray 1-2 sprays per nostril twice a day as needed for drainage.   Continue environmental control measures.  Continue Flonase 1 spray twice a day.  Nasal saline spray  (i.e., Simply Saline) or nasal saline lavage (i.e., NeilMed) is recommended as needed and prior to medicated nasal sprays.  May use over the counter antihistamines such as Claritin (loratadine), Allegra (fexofenadine) daily as needed and may take twice a day if needed.   Allergic conjunctivitis of both eyes  See assessment and plan as above for allergic rhinitis.  Adverse food reaction Past history - Breaks out in hives after eating citrus fruits.  Continue to avoid citrus fruits.   Drug reaction Past history - Hives with penicillin (over 10 years ago) and Vicodin.   Continue to avoid for now. Consider penicillin testing in future.   Return in about 3 months (around 12/18/2020).  Meds ordered this encounter  Medications  . sulfamethoxazole-trimethoprim (BACTRIM DS) 800-160 MG tablet    Sig: Take 1 tablet by mouth 2 (two) times daily.    Dispense:  14 tablet    Refill:  0  . predniSONE (DELTASONE) 10 MG tablet    Sig: Take 3 tablets twice a day for 5 days then stop.    Dispense:  30 tablet    Refill:  0   Diagnostics: Spirometry:  Tracings reviewed. Her effort: Good reproducible efforts. FVC: 1.92L FEV1: 1.71L, 92% predicted FEV1/FVC ratio: 89% Interpretation: Spirometry consistent with normal pattern.  Please see scanned spirometry results for details.  Medication List:  Current Outpatient Medications  Medication Sig Dispense Refill  . albuterol (PROAIR HFA) 108 (90 Base) MCG/ACT inhaler INHALE 2 PUFFS INTO THE LUNGS EVERY 6 HOURS AS NEEDED FOR WHEEZING OR SHORTNESS OF BREATH 18 g 2  . albuterol (PROVENTIL) (2.5 MG/3ML) 0.083% nebulizer solution Take 3 mLs (2.5 mg total) by nebulization every 6 (six) hours as needed for wheezing or shortness of breath. 360 mL 1  . fluticasone (FLONASE) 50 MCG/ACT nasal spray Place 2 sprays into both nostrils 2 (two) times a day.    . Magnesium Gluconate (MAG-G) 500 (27 Mg) MG TABS Take by mouth.    . montelukast (SINGULAIR) 10 MG tablet  TAKE 1 TABLET(10 MG) BY MOUTH AT BEDTIME 30 tablet 3  . Respiratory Therapy Supplies (ONE FLOW SPIROMETER) DEVI 1 Device by Does not apply route 3 (three) times daily as needed. 1 each 0  . sertraline (ZOLOFT) 25 MG tablet Take 1 tablet (25 mg total) by mouth daily. 90 tablet 1  . SUMAtriptan (IMITREX) 50 MG tablet Take 50 mg by mouth every 2 (two) hours as needed for migraine. May repeat in 2 hours if headache persists or recurs.    . SYMBICORT 160-4.5 MCG/ACT inhaler Inhale 2 puffs into the lungs 2 (two) times daily. With spacer. Rinse mouth afterwards. 1 Inhaler 5  . Azelastine HCl 0.15 % SOLN Use 1-2 sprays per nostril twice a day as needed for drainage. 30 mL 5  . Blood Glucose Monitoring Suppl (ACCU-CHEK AVIVA PLUS) w/Device KIT 1 Device by Does not apply route daily. 1 kit 0  . glucose blood (ACCU-CHEK AVIVA PLUS) test strip 2 times daily 180 each 1  . Lancets (ACCU-CHEK SOFT TOUCH) lancets 2/day 180 each 12  . predniSONE (DELTASONE) 10 MG tablet Take 3 tablets twice a day for 5 days then stop. 30 tablet 0  . sulfamethoxazole-trimethoprim (BACTRIM DS) 800-160 MG tablet Take 1 tablet by mouth  2 (two) times daily. 14 tablet 0   No current facility-administered medications for this visit.   Allergies: Allergies  Allergen Reactions  . Citrus Hives  . Penicillins Hives  . Topiramate   . Vicodin [Hydrocodone-Acetaminophen] Hives   I reviewed her past medical history, social history, family history, and environmental history and no significant changes have been reported from her previous visit.  Review of Systems  Constitutional: Negative for appetite change, chills, fever and unexpected weight change.  HENT: Negative for congestion, postnasal drip and rhinorrhea.   Eyes: Negative for itching.  Respiratory: Negative for cough, chest tightness, shortness of breath and wheezing.   Cardiovascular: Negative for chest pain.  Gastrointestinal: Negative for abdominal pain.  Genitourinary:  Negative for difficulty urinating.  Skin: Positive for rash.  Allergic/Immunologic: Positive for environmental allergies. Negative for food allergies.   Objective: BP 130/76   Pulse 68   Temp 98 F (36.7 C) (Tympanic)   Resp 20   Ht 5' (1.524 m)   Wt 181 lb 6.4 oz (82.3 kg)   SpO2 100%   BMI 35.43 kg/m  Body mass index is 35.43 kg/m. Physical Exam Vitals and nursing note reviewed.  Constitutional:      Appearance: Normal appearance. She is well-developed.  HENT:     Head: Normocephalic and atraumatic.     Right Ear: Tympanic membrane and external ear normal.     Left Ear: Tympanic membrane and external ear normal.     Nose: Nose normal.     Mouth/Throat:     Mouth: Mucous membranes are moist.     Pharynx: Oropharynx is clear.  Eyes:     Conjunctiva/sclera: Conjunctivae normal.  Cardiovascular:     Rate and Rhythm: Normal rate and regular rhythm.     Heart sounds: Normal heart sounds. No murmur heard.  No friction rub. No gallop.   Pulmonary:     Effort: Pulmonary effort is normal.     Breath sounds: Normal breath sounds. No wheezing or rales.  Musculoskeletal:     Cervical back: Neck supple.  Skin:    General: Skin is warm.     Findings: Rash present.     Comments: Erythematous patch on neck and jawline which is warm to the touch and firm .  Neurological:     Mental Status: She is alert and oriented to person, place, and time.  Psychiatric:        Behavior: Behavior normal.    Previous notes and tests were reviewed. The plan was reviewed with the patient/family, and all questions/concerned were addressed.  It was my pleasure to see Tykeria today and participate in her care. Please feel free to contact me with any questions or concerns.  Sincerely,  Rexene Alberts, DO Allergy & Immunology  [facility] office: [phone] [facility] office: [place] office: [phone]

## 2020-09-18 NOTE — Patient Instructions (Addendum)
Insect sting  Take additional zyrtec 10mg  at night.  Start prednisone taper.  Take prednisone 30mg  twice daily x 5 days then stop.   Start bactrim antibiotics 1 tablet twice a day for 7 days.  Take pictures if it gets worse.   Reactive airway disease  Daily controller medication(s):continue with Symbicort 160 to 2 puffs twice a day with spacer and rinse mouth afterwards.  STOP spiriva 1.7mcg for now.   Continue with Singulair 10mg  daily.   Prior to physical activity:May use albuterol rescue inhaler 2 puffs 5 to 15 minutes prior to strenuous physical activities.  Rescue medications:May use albuterol rescue inhaler 2 puffs or nebulizer every 4 to 6 hours as needed for shortness of breath, chest tightness, coughing, and wheezing. Monitor frequency of use. Asthma control goals:  Full participation in all desired activities (may need albuterol before activity) Albuterol use two times or less a week on average (not counting use with activity) Cough interfering with sleep two times or less a month Oral steroids no more than once a year No hospitalizations  Seasonal allergic rhinitis due to pollen 2020 skin testing showed: positive to grass pollen only.  May use azelastine nasal spray 1-2 sprays per nostril twice a day as needed for drainage.   Continue environmental control measures.  Continue Flonase 1 spray twice a day.  Nasal saline spray (i.e., Simply Saline) or nasal saline lavage (i.e., NeilMed) is recommended as needed and prior to medicated nasal sprays.  May use over the counter antihistamines such as Claritin (loratadine), Allegra (fexofenadine) daily as needed and may take twice a day if needed.   Drug reaction Past history - Hives with penicillin (over 10 years ago) and Vicodin.   Continue to avoid for now. Consider penicillin testing in future.   Adverse food reaction  Continue to avoid citrus fruits.    Follow up in 3 months or sooner if needed.

## 2020-09-18 NOTE — Assessment & Plan Note (Addendum)
   See assessment and plan as above for allergic rhinitis.  

## 2020-09-21 ENCOUNTER — Ambulatory Visit: Payer: 59 | Admitting: Family

## 2020-10-03 DIAGNOSIS — M25561 Pain in right knee: Secondary | ICD-10-CM | POA: Diagnosis not present

## 2020-10-10 ENCOUNTER — Ambulatory Visit: Payer: BC Managed Care – PPO | Attending: Internal Medicine

## 2020-10-10 ENCOUNTER — Other Ambulatory Visit: Payer: Self-pay

## 2020-10-10 DIAGNOSIS — Z23 Encounter for immunization: Secondary | ICD-10-CM

## 2020-10-10 NOTE — Progress Notes (Signed)
   Covid-19 Vaccination Clinic  Name:  [name]    MRN: [id number] DOB: 08-02-1966  10/10/2020  [name] was observed post Covid-19 immunization for 15 minutes without incident. She was provided with Vaccine Information Sheet and instruction to access the V-Safe system.   [name] was instructed to call [phone] with any severe reactions post vaccine: Marland Kitchen Difficulty breathing  . Swelling of face and throat  . A fast heartbeat  . A bad rash all over body  . Dizziness and weakness

## 2020-10-19 DIAGNOSIS — M25561 Pain in right knee: Secondary | ICD-10-CM | POA: Diagnosis not present

## 2020-10-23 DIAGNOSIS — M25561 Pain in right knee: Secondary | ICD-10-CM | POA: Diagnosis not present

## 2020-10-26 ENCOUNTER — Other Ambulatory Visit: Payer: Self-pay | Admitting: Family Medicine

## 2020-10-26 DIAGNOSIS — F419 Anxiety disorder, unspecified: Secondary | ICD-10-CM

## 2020-10-27 DIAGNOSIS — M25561 Pain in right knee: Secondary | ICD-10-CM | POA: Diagnosis not present

## 2020-12-17 ENCOUNTER — Other Ambulatory Visit: Payer: Self-pay | Admitting: Family Medicine

## 2020-12-17 DIAGNOSIS — J45909 Unspecified asthma, uncomplicated: Secondary | ICD-10-CM

## 2020-12-21 ENCOUNTER — Other Ambulatory Visit: Payer: Self-pay | Admitting: Family Medicine

## 2020-12-21 DIAGNOSIS — J45909 Unspecified asthma, uncomplicated: Secondary | ICD-10-CM

## 2020-12-23 ENCOUNTER — Ambulatory Visit: Payer: 59 | Admitting: Allergy

## 2020-12-30 ENCOUNTER — Ambulatory Visit: Payer: BC Managed Care – PPO | Admitting: Allergy

## 2020-12-30 ENCOUNTER — Encounter: Payer: Self-pay | Admitting: Allergy

## 2020-12-30 ENCOUNTER — Other Ambulatory Visit: Payer: Self-pay

## 2020-12-30 VITALS — BP 112/86 | HR 68 | Temp 97.6°F | Resp 14 | Ht 59.0 in | Wt 187.8 lb

## 2020-12-30 DIAGNOSIS — J4541 Moderate persistent asthma with (acute) exacerbation: Secondary | ICD-10-CM | POA: Insufficient documentation

## 2020-12-30 DIAGNOSIS — J301 Allergic rhinitis due to pollen: Secondary | ICD-10-CM

## 2020-12-30 DIAGNOSIS — T63441D Toxic effect of venom of bees, accidental (unintentional), subsequent encounter: Secondary | ICD-10-CM | POA: Diagnosis not present

## 2020-12-30 DIAGNOSIS — H1013 Acute atopic conjunctivitis, bilateral: Secondary | ICD-10-CM

## 2020-12-30 DIAGNOSIS — J4540 Moderate persistent asthma, uncomplicated: Secondary | ICD-10-CM | POA: Diagnosis not present

## 2020-12-30 DIAGNOSIS — T50905D Adverse effect of unspecified drugs, medicaments and biological substances, subsequent encounter: Secondary | ICD-10-CM

## 2020-12-30 DIAGNOSIS — T781XXD Other adverse food reactions, not elsewhere classified, subsequent encounter: Secondary | ICD-10-CM

## 2020-12-30 HISTORY — DX: Moderate persistent asthma, uncomplicated: J45.40

## 2020-12-30 MED ORDER — BUDESONIDE-FORMOTEROL FUMARATE 160-4.5 MCG/ACT IN AERO: 2.0000 | INHALATION_SPRAY | Freq: Two times a day (BID) | RESPIRATORY_TRACT | 5 refills | Status: DC

## 2020-12-30 MED ORDER — LEVALBUTEROL HCL 1.25 MG/3ML IN NEBU: 1.2500 mg | INHALATION_SOLUTION | Freq: Four times a day (QID) | RESPIRATORY_TRACT | 2 refills | Status: DC | PRN

## 2020-12-30 MED ORDER — SPIRIVA RESPIMAT 1.25 MCG/ACT IN AERS: 2.0000 | INHALATION_SPRAY | Freq: Every day | RESPIRATORY_TRACT | 5 refills | Status: DC

## 2020-12-30 NOTE — Assessment & Plan Note (Addendum)
Past history - Patient had covid in January and her breathing is slowly improved. Cardiology work up - (Echo reveals normal systolic function and grade 1 diastolic dysfunction). Interim history - stopped maintenance inhalers in October and started noticing flares in December. Albuterol nebulizer causes shaky hands.  Today's spirometry was normal - used albuterol recently.   ACT score 6.  For the next week, use levoalbuterol nebulizer twice a day before taking Symbicort.   If not feeling better, let us know and will send in prednisone - declines today as it causes weight gain for her.   If the pharmacy does not have Symbicort, please let us know.   Daily controller medication(s):RESTART Symbicort 2 puffs twice a day with spacer and rinse mouth afterwards.  RESTART spiriva 1.19mcg 2 puffs daily. Sample given.   Continue with Singulair 10mg  daily.   Spacer given and demonstrated proper use with inhaler. Patient understood technique and all questions/concerned were addressed.   May use albuterol rescue inhaler 2 puffs or nebulizer every 4 to 6 hours as needed for shortness of breath, chest tightness, coughing, and wheezing. May use albuterol rescue inhaler 2 puffs 5 to 15 minutes prior to strenuous physical activities. Monitor frequency of use.   Get spirometry at next visit.

## 2020-12-30 NOTE — Assessment & Plan Note (Signed)
   See assessment and plan as above for allergic rhinitis.  

## 2020-12-30 NOTE — Patient Instructions (Addendum)
Asthma  For the next week, use levoalbuterol nebulizer twice a day before taking Symbicort.   If not feeling better, let us know and will send in prednisone.   If the pharmacy does not have Symbicort, please let us know.    Daily controller medication(s):RESTART Symbicort 2 puffs twice a day with spacer and rinse mouth afterwards.  RESTART spiriva 1.95mcg 2 puffs daily. Sample given.   Continue with Singulair 10mg  daily.   Spacer given and demonstrated proper use with inhaler. Patient understood technique and all questions/concerned were addressed.   May use albuterol rescue inhaler 2 puffs or nebulizer every 4 to 6 hours as needed for shortness of breath, chest tightness, coughing, and wheezing. May use albuterol rescue inhaler 2 puffs 5 to 15 minutes prior to strenuous physical activities. Monitor frequency of use.  Asthma control goals:  Full participation in all desired activities (may need albuterol before activity) Albuterol use two times or less a week on average (not counting use with activity) Cough interfering with sleep two times or less a month Oral steroids no more than once a year No hospitalizations  Seasonal allergic rhinitis due to pollen 2020 skin testing showed: positive to grass pollen only.  Continue environmental control measures.  May use Flonase (fluticasone) nasal spray 1 spray per nostril twice a day as needed for nasal congestion.   May use azelastine nasal spray 1-2 sprays per nostril twice a day as needed for drainage.   Nasal saline spray (i.e., Simply Saline) or nasal saline lavage (i.e., NeilMed) is recommended as needed and prior to medicated nasal sprays.  May use over the counter antihistamines such as Claritin (loratadine), Allegra (fexofenadine) daily as needed and may take twice a day if needed.   Insect sting Get bloodwork:  We are ordering labs, so please allow 1-2 weeks for the results to come back. With the newly implemented  Cures Act, the labs might be visible to you at the same time that they become visible to me. However, I will not address the results until all of the results are back, so please be patient.   Drug reaction  Continue to avoid for now. Consider penicillin testing in future.   Adverse food reaction  Continue to avoid citrus fruits.    Follow up in 2 months or sooner if needed.

## 2020-12-30 NOTE — Assessment & Plan Note (Signed)
No additional stings. Get bloodwork.

## 2020-12-30 NOTE — Assessment & Plan Note (Signed)
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 Tried Claritin-D, Singulair and Flonase with some benefit. 2020 skin testing showed: positive to grass pollen only. Ryvent and zyrtec caused grogginess.  Interim history - stable with below regimen.  Continue environmental control measures.  May use Flonase (fluticasone) nasal spray 1 spray per nostril twice a day as needed for nasal congestion.   May use azelastine nasal spray 1-2 sprays per nostril twice a day as needed for drainage.   Nasal saline spray (i.e., Simply Saline) or nasal saline lavage (i.e., NeilMed) is recommended as needed and prior to medicated nasal sprays.  May use over the counter antihistamines such as Claritin (loratadine), Allegra (fexofenadine) daily as needed and may take twice a day if needed.

## 2020-12-30 NOTE — Assessment & Plan Note (Signed)
Past history - Breaks out in hives after eating citrus fruits.  Continue to avoid citrus fruits.

## 2020-12-30 NOTE — Progress Notes (Signed)
Follow Up Note  RE: [name] MRN: [id number] DOB: Jul 06, 1966 Date of Office Visit: 12/30/2020  Referring provider: Martinique, Betty G, MD Primary care provider: Martinique, Betty G, MD  Chief Complaint: Asthma (No good. A lot of treatments and has to carry nebulizer to work. Shortness of breath, wheezing, coughing. 30 min after treatment she is feeling better. Happens at various times of the day. Doesn't affect sleeping. Knee surgery Feb. 3. 22)  History of Present Illness: I had the pleasure of seeing [name] for a follow up visit at the [facility] on 12/30/2020. She is a 55 y.o. female, who is being followed for bee sting reaction, asthma, allergic rhinoconjunctivitis, adverse food reaction and adverse drug reaction. Her previous allergy office visit was on 09/18/2020 with Dr. Maudie Mercury. Today is a regular follow up visit.  Breathing: ACT score 6.  Patient noted worsening breathing symptoms in December - wheezing, coughing, shortness of breath and chest tightness.  The albuterol HFA was not helping so she has been using albuterol nebulizer treatment with better benefit daily.  She does get some hand shaking after the treatment though.  Patient stopped both Spiriva and Symbicort in October due to pharmacy not having Symbicort in stock and never followed up on this.  She has been also stressed due her mother passing away right before Thanksgiving. Denies any ER/urgent care visits or prednisone use since the last visit.  Allergic rhino conjunctivitis:  Still taking Singulair, Allegra daily, Flonase 1 spray BID with good benefit. No nosebleeds.  Bee sting reaction No stings since the last visit.  Adverse food reaction No other reactions.   Assessment and Plan: [name] is a 55 y.o. female with: Not well controlled moderate persistent asthma Past history - Patient had covid in January and her breathing is slowly improved. Cardiology work up - (Echo reveals normal  systolic function and grade 1 diastolic dysfunction). Interim history - stopped maintenance inhalers in October and started noticing flares in December. Albuterol nebulizer causes shaky hands.  Today's spirometry was normal - used albuterol recently.   ACT score 6.  For the next week, use levoalbuterol nebulizer twice a day before taking Symbicort.   If not feeling better, let us know and will send in prednisone - declines today as it causes weight gain for her.   If the pharmacy does not have Symbicort, please let us know.   Daily controller medication(s):RESTART Symbicort 136mg 2 puffs twice a day with spacer and rinse mouth afterwards.  RESTART spiriva 1.225m 2 puffs daily. Sample given.   Continue with Singulair 1020maily.   Spacer given and demonstrated proper use with inhaler. Patient understood technique and all questions/concerned were addressed.   May use albuterol rescue inhaler 2 puffs or nebulizer every 4 to 6 hours as needed for shortness of breath, chest tightness, coughing, and wheezing. May use albuterol rescue inhaler 2 puffs 5 to 15 minutes prior to strenuous physical activities. Monitor frequency of use.   Get spirometry at next visit.  Seasonal allergic rhinitis due to pollen Past history - Rhino conjunctivitis symptoms for the past 20 years during the spring through fall. Tried Claritin-D, Singulair and Flonase with some benefit. 2020 skin testing showed: positive to grass pollen only. Ryvent and zyrtec caused grogginess.  Interim history - stable with below regimen.  Continue environmental control measures.  May use Flonase (fluticasone) nasal spray 1 spray per nostril twice a day as needed for nasal congestion.  May use azelastine nasal spray 1-2 sprays per nostril twice a day as needed for drainage.   Nasal saline spray (i.e., Simply Saline) or nasal saline lavage (i.e., NeilMed) is recommended as needed and prior to medicated nasal sprays.  May use  over the counter antihistamines such as Claritin (loratadine), Allegra (fexofenadine) daily as needed and may take twice a day if needed.   Allergic conjunctivitis of both eyes  See assessment and plan as above for allergic rhinitis.  Bee sting reaction No additional stings. Get bloodwork.  Adverse food reaction Past history - Breaks out in hives after eating citrus fruits.  Continue to avoid citrus fruits.   Drug reaction Past history - Hives with penicillin (over 10 years ago) and Vicodin.   Continue to avoid for now. Consider penicillin testing in future.   Return in about 2 months (around 02/27/2021).  Meds ordered this encounter  Medications  . budesonide-formoterol (SYMBICORT) 160-4.5 MCG/ACT inhaler    Sig: Inhale 2 puffs into the lungs in the morning and at bedtime. with spacer and rinse mouth afterwards.    Dispense:  1 each    Refill:  5  . levalbuterol (XOPENEX) 1.25 MG/3ML nebulizer solution    Sig: Take 1.25 mg by nebulization every 6 (six) hours as needed for wheezing or shortness of breath (coughing fits).    Dispense:  75 mL    Refill:  2  . Tiotropium Bromide Monohydrate (SPIRIVA RESPIMAT) 1.25 MCG/ACT AERS    Sig: Inhale 2 puffs into the lungs daily.    Dispense:  4 g    Refill:  5    Lab Orders     Tryptase     Allergen Hymenoptera Panel  Diagnostics: Spirometry:  Tracings reviewed. Her effort: Good reproducible efforts. FVC: 1.99L FEV1: 1.66L, 93% predicted FEV1/FVC ratio: 83% Interpretation: Spirometry consistent with normal pattern.  Please see scanned spirometry results for details.  Medication List:  Current Outpatient Medications  Medication Sig Dispense Refill  . albuterol (PROAIR HFA) 108 (90 Base) MCG/ACT inhaler INHALE 2 PUFFS INTO THE LUNGS EVERY 6 HOURS AS NEEDED FOR WHEEZING OR SHORTNESS OF BREATH 18 g 2  . Azelastine HCl 0.15 % SOLN Use 1-2 sprays per nostril twice a day as needed for drainage. 30 mL 5  . budesonide-formoterol  (SYMBICORT) 160-4.5 MCG/ACT inhaler Inhale 2 puffs into the lungs in the morning and at bedtime. with spacer and rinse mouth afterwards. 1 each 5  . fluticasone (FLONASE) 50 MCG/ACT nasal spray Place 2 sprays into both nostrils 2 (two) times a day.    . levalbuterol (XOPENEX) 1.25 MG/3ML nebulizer solution Take 1.25 mg by nebulization every 6 (six) hours as needed for wheezing or shortness of breath (coughing fits). 75 mL 2  . Magnesium Gluconate 500 (27 Mg) MG TABS Take by mouth.    . montelukast (SINGULAIR) 10 MG tablet TAKE 1 TABLET(10 MG) BY MOUTH AT BEDTIME 30 tablet 3  . Respiratory Therapy Supplies (ONE FLOW SPIROMETER) DEVI 1 Device by Does not apply route 3 (three) times daily as needed. 1 each 0  . sertraline (ZOLOFT) 25 MG tablet TAKE 1 TABLET(25 MG) BY MOUTH DAILY 90 tablet 1  . SUMAtriptan (IMITREX) 50 MG tablet Take 50 mg by mouth every 2 (two) hours as needed for migraine. May repeat in 2 hours if headache persists or recurs.    . Tiotropium Bromide Monohydrate (SPIRIVA RESPIMAT) 1.25 MCG/ACT AERS Inhale 2 puffs into the lungs daily. 4 g 5  . Blood  Glucose Monitoring Suppl (ACCU-CHEK AVIVA PLUS) w/Device KIT 1 Device by Does not apply route daily. 1 kit 0  . glucose blood (ACCU-CHEK AVIVA PLUS) test strip 2 times daily 180 each 1  . Lancets (ACCU-CHEK SOFT TOUCH) lancets 2/day 180 each 12  . sulfamethoxazole-trimethoprim (BACTRIM DS) 800-160 MG tablet Take 1 tablet by mouth 2 (two) times daily. 14 tablet 0   No current facility-administered medications for this visit.   Allergies: Allergies  Allergen Reactions  . Citrus Hives  . Penicillins Hives  . Topiramate   . Vicodin [Hydrocodone-Acetaminophen] Hives   I reviewed her past medical history, social history, family history, and environmental history and no significant changes have been reported from her previous visit.  Review of Systems  Constitutional: Negative for appetite change, chills, fever and unexpected weight  change.  HENT: Negative for congestion, postnasal drip and rhinorrhea.   Eyes: Negative for itching.  Respiratory: Positive for cough, chest tightness, shortness of breath and wheezing.   Cardiovascular: Negative for chest pain.  Gastrointestinal: Negative for abdominal pain.  Genitourinary: Negative for difficulty urinating.  Allergic/Immunologic: Positive for environmental allergies. Negative for food allergies.   Objective: BP 112/86   Pulse 68   Temp 97.6 F (36.4 C)   Resp 14   Ht _0  (1.499 m)   Wt 187 lb 12.8 oz (85.2 kg)   SpO2 96%   BMI 37.93 kg/m  Body mass index is 37.93 kg/m. Physical Exam Vitals and nursing note reviewed.  Constitutional:      Appearance: Normal appearance. She is well-developed.  HENT:     Head: Normocephalic and atraumatic.     Right Ear: Tympanic membrane and external ear normal.     Left Ear: Tympanic membrane and external ear normal.     Nose: Nose normal.     Mouth/Throat:     Mouth: Mucous membranes are moist.     Pharynx: Oropharynx is clear.  Eyes:     Conjunctiva/sclera: Conjunctivae normal.  Cardiovascular:     Rate and Rhythm: Normal rate and regular rhythm.     Heart sounds: Normal heart sounds. No murmur heard. No friction rub. No gallop.   Pulmonary:     Effort: Pulmonary effort is normal.     Breath sounds: Normal breath sounds. No wheezing or rales.  Musculoskeletal:     Cervical back: Neck supple.  Skin:    General: Skin is warm.     Findings: No rash.  Neurological:     Mental Status: She is alert and oriented to person, place, and time.  Psychiatric:        Behavior: Behavior normal.    Previous notes and tests were reviewed. The plan was reviewed with the patient/family, and all questions/concerned were addressed.  It was my pleasure to see [name] today and participate in her care. Please feel free to contact me with any questions or concerns.  Sincerely,  Rexene Alberts, DO Allergy & Immunology  [facility] office: [place] office: [phone]

## 2020-12-30 NOTE — Assessment & Plan Note (Signed)
Past history - Hives with penicillin (over 10 years ago) and Vicodin.   Continue to avoid for now. Consider penicillin testing in future.

## 2021-01-01 ENCOUNTER — Encounter: Payer: Self-pay | Admitting: Allergy

## 2021-01-02 LAB — ALLERGEN HYMENOPTERA PANEL
Bumblebee: 0.18 kU/L — AB
Honeybee IgE: 0.21 kU/L — AB
Hornet, White Face, IgE: 4.17 kU/L — AB
Hornet, Yellow, IgE: 0.64 kU/L — AB
Paper Wasp IgE: 0.53 kU/L — AB
Yellow Jacket, IgE: 15.7 kU/L — AB

## 2021-01-02 LAB — TRYPTASE: Tryptase: 8.4 ug/L (ref 2.2–13.2)

## 2021-01-06 ENCOUNTER — Encounter: Payer: Self-pay | Admitting: Allergy

## 2021-01-08 ENCOUNTER — Telehealth: Payer: Self-pay | Admitting: *Deleted

## 2021-01-08 MED ORDER — EPINEPHRINE 0.3 MG/0.3ML IJ SOAJ: 0.3000 mg | INTRAMUSCULAR | 2 refills | Status: AC | PRN

## 2021-01-08 NOTE — Telephone Encounter (Signed)
I sent in epipen for patient.  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  Thank you.

## 2021-01-08 NOTE — Telephone Encounter (Signed)
Error

## 2021-01-15 ENCOUNTER — Other Ambulatory Visit: Payer: Self-pay | Admitting: Allergy

## 2021-01-20 ENCOUNTER — Encounter: Payer: Self-pay | Admitting: Pulmonary Disease

## 2021-01-20 ENCOUNTER — Other Ambulatory Visit: Payer: Self-pay

## 2021-01-20 ENCOUNTER — Ambulatory Visit: Payer: BC Managed Care – PPO | Admitting: Pulmonary Disease

## 2021-01-20 VITALS — BP 114/70 | HR 74 | Temp 97.3°F | Ht 59.0 in | Wt 188.0 lb

## 2021-01-20 DIAGNOSIS — J301 Allergic rhinitis due to pollen: Secondary | ICD-10-CM | POA: Diagnosis not present

## 2021-01-20 DIAGNOSIS — J4540 Moderate persistent asthma, uncomplicated: Secondary | ICD-10-CM

## 2021-01-20 LAB — CBC WITH DIFFERENTIAL/PLATELET
Basophils Absolute: 0 10*3/uL (ref 0.0–0.1)
Basophils Relative: 0.5 % (ref 0.0–3.0)
Eosinophils Absolute: 0.2 10*3/uL (ref 0.0–0.7)
Eosinophils Relative: 2.9 % (ref 0.0–5.0)
HCT: 42 % (ref 36.0–46.0)
Hemoglobin: 14.4 g/dL (ref 12.0–15.0)
Lymphocytes Relative: 28.1 % (ref 12.0–46.0)
Lymphs Abs: 2.2 10*3/uL (ref 0.7–4.0)
MCHC: 34.2 g/dL (ref 30.0–36.0)
MCV: 88.4 fl (ref 78.0–100.0)
Monocytes Absolute: 0.6 10*3/uL (ref 0.1–1.0)
Monocytes Relative: 7.7 % (ref 3.0–12.0)
Neutro Abs: 4.9 10*3/uL (ref 1.4–7.7)
Neutrophils Relative %: 60.8 % (ref 43.0–77.0)
Platelets: 241 10*3/uL (ref 150.0–400.0)
RBC: 4.75 Mil/uL (ref 3.87–5.11)
RDW: 13.2 % (ref 11.5–15.5)
WBC: 8 10*3/uL (ref 4.0–10.5)

## 2021-01-20 MED ORDER — BREZTRI AEROSPHERE 160-9-4.8 MCG/ACT IN AERO: 2.0000 | INHALATION_SPRAY | Freq: Two times a day (BID) | RESPIRATORY_TRACT | 0 refills | Status: AC

## 2021-01-20 MED ORDER — BREZTRI AEROSPHERE 160-9-4.8 MCG/ACT IN AERO: 2.0000 | INHALATION_SPRAY | Freq: Two times a day (BID) | RESPIRATORY_TRACT | 11 refills | Status: DC

## 2021-01-20 NOTE — Addendum Note (Signed)
Addended by: Maurene Capes on: 01/20/2021 10:54 AM   Modules accepted: Orders

## 2021-01-20 NOTE — Patient Instructions (Signed)
Nice to meet you  I sent a new prescription for [name] to replace the Symbicort and Spiriva. Use 2 puffs twice a day, rinse out mouth after every use.  We will get labs today to see if you are a candidate for biologic therapy - these are shots. We will teach you or a friend/family member how to use or we can arrange visits in clinic for this.   I hope your surgery goes well.  Come back for follow up in 3 months with Dr. Judeth Horn or sooner as needed

## 2021-01-20 NOTE — Progress Notes (Signed)
'@Patient'  ID: [name], female    DOB: [birth year], 55 y.o.   MRN: [id number]  CC: wheezing  Referring provider: Martinique, Betty G, MD  HPI:   55 year old woman whom we are seeing in consultation at the request of her allergist for evaluation of wheezing, asthma.  Overall she is doing okay.  She never had issues with asthma in the past as a child or older adult.  She contracted Covid over a year ago and has had a second bout of Covid as well.  After the first bout, she developed current symptoms.  Patient describes wheezing, chest tightness, shortness of breath.  Symptoms worse with change in seasons, prickly cold weather.  No timing during the day were better or worse.  She has been on several different inhalers.  She does take Symbicort and prickly levalbuterol have been helpful.  She is adherent to current regimen-twice daily, Symbicort twice daily, Spiriva once daily.  She is not using her as needed bronchodilators currently on questioning.  She does get symptoms throughout the day, at least couple times a week of increased chest tightness or wheeze.  She can focus on breathing and sometimes this gets better.  She has significant seasonal allergies.  She even had sinus surgery in the 1990s.  He currently is adherent to her antihistamine, nasal sprays, montelukast.  These have helped somewhat but again symptoms seem to flare with cold weather.  Chest x-ray 02/13/2020 personally reviewed and interpreted as clear lungs, no infiltrate, pleural effusion, pneumothorax.  PMH: Seasonal allergies, elevated blood glucoses Surgical history: Cholecystectomy, knee surgery, tonsillectomy, sinus surgery Family history: Mother with dementia, diabetes, father with prostate cancer, coronary artery disease Social history: She is a never smoker, lives in [place] / Pulmonary Flowsheets:   ACT:  Asthma Control Test ACT Total Score  12/30/2020 6  09/18/2020 25  02/26/2020 15    MMRC: No  flowsheet data found.  Epworth:  No flowsheet data found.  Tests:   FENO:  No results found for: NITRICOXIDE  PFT: Serial PFTs reviewed, 06/19/2019 interpreted as fixed obstruction that resolves after bronchodilator with significant bronchodilator response with 500 cc increase in FVC, most recent 12/31/2020 interpreted as normal spirometry  WALK:  No flowsheet data found.  Imaging: Personally reviewed and as per EMR  Lab Results: Personally reviewed, notably eosinophils elevated to as high as 300 CBC    Component Value Date/Time   WBC 7.6 02/12/2020 1505   RBC 4.58 02/12/2020 1505   HGB 13.6 02/12/2020 1505   HGB 14.5 06/19/2019 1606   HCT 41.3 02/12/2020 1505   HCT 43.4 06/19/2019 1606   PLT 208.0 02/12/2020 1505   PLT 260 06/19/2019 1606   MCV 90.0 02/12/2020 1505   MCV 89 06/19/2019 1606   MCH 29.6 06/19/2019 1606   MCH 29.2 09/03/2014 0950   MCHC 32.9 02/12/2020 1505   RDW 13.9 02/12/2020 1505   RDW 13.3 06/19/2019 1606   LYMPHSABS 3.1 02/12/2020 1505   LYMPHSABS 3.4 (H) 06/19/2019 1606   MONOABS 0.7 02/12/2020 1505   EOSABS 0.3 02/12/2020 1505   EOSABS 0.4 06/19/2019 1606   BASOSABS 0.1 02/12/2020 1505   BASOSABS 0.1 06/19/2019 1606    BMET    Component Value Date/Time   NA 140 08/24/2020 0903   NA 141 06/19/2019 1606   K 4.2 08/24/2020 0903   CL 105 08/24/2020 0903   CO2 26 08/24/2020 0903   GLUCOSE 165 (H) 08/24/2020 0903   BUN 15  08/24/2020 0903   BUN 10 06/19/2019 1606   CREATININE 0.88 08/24/2020 0903   CALCIUM 9.9 08/24/2020 0903   GFRNONAA 74 06/19/2019 1606   GFRAA 85 06/19/2019 1606    BNP No results found for: BNP  ProBNP No results found for: PROBNP  Specialty Problems      Pulmonary Problems   Seasonal allergic rhinitis due to pollen   Not well controlled moderate persistent asthma      Allergies  Allergen Reactions  . Bee Venom Anaphylaxis  . Citrus Hives  . Penicillins Hives  . Topiramate   . Vicodin  [Hydrocodone-Acetaminophen] Hives    Immunization History  Administered Date(s) Administered  . Influenza,inj,Quad PF,6+ Mos 09/21/2019  . PFIZER(Purple Top)SARS-COV-2 Vaccination 10/10/2020  . Tdap 10/23/2015    Past Medical History:  Diagnosis Date  . Allergy   . Arthritis   . Atypical chest pain 10/25/2019  . Diabetes mellitus without complication ([employer])    4970  . Essential hypertension 10/25/2019  . Family history of anesthesia complication    "Mama did; she has alzheimer's; any anesthesia puts her in the twilight zone"  . Gallstones   . GERD (gastroesophageal reflux disease)   . Migraine    "cluster migraine"  . Not well controlled moderate persistent asthma 12/30/2020  . Sciatic nerve injury    "right side"    Tobacco History: Social History   Tobacco Use  Smoking Status Never Smoker  Smokeless Tobacco Never Used   Counseling given: Not Answered   Continue to not smoke  Outpatient Encounter Medications as of 01/20/2021  Medication Sig  . albuterol (PROAIR HFA) 108 (90 Base) MCG/ACT inhaler INHALE 2 PUFFS INTO THE LUNGS EVERY 6 HOURS AS NEEDED FOR WHEEZING OR SHORTNESS OF BREATH  . Budeson-Glycopyrrol-Formoterol (BREZTRI AEROSPHERE) 160-9-4.8 MCG/ACT AERO Inhale 2 puffs into the lungs in the morning and at bedtime.  Marland Kitchen EPINEPHrine 0.3 mg/0.3 mL IJ SOAJ injection Inject 0.3 mg into the muscle as needed for anaphylaxis.  . fluticasone (FLONASE) 50 MCG/ACT nasal spray Place 2 sprays into both nostrils 2 (two) times a day.  . levalbuterol (XOPENEX) 1.25 MG/3ML nebulizer solution USE 1 VIAL VIA NEBULIZER EVERY 6 HOURS AS NEEDED FOR WHEEZING OR SHORTNESS OF BREATH AND COUGH  . Magnesium Gluconate 500 (27 Mg) MG TABS Take by mouth.  . montelukast (SINGULAIR) 10 MG tablet TAKE 1 TABLET(10 MG) BY MOUTH AT BEDTIME  . Respiratory Therapy Supplies (ONE FLOW SPIROMETER) DEVI 1 Device by Does not apply route 3 (three) times daily as needed.  . sertraline (ZOLOFT) 25 MG tablet  TAKE 1 TABLET(25 MG) BY MOUTH DAILY  . SUMAtriptan (IMITREX) 50 MG tablet Take 50 mg by mouth every 2 (two) hours as needed for migraine. May repeat in 2 hours if headache persists or recurs.  . [DISCONTINUED] budesonide-formoterol (SYMBICORT) 160-4.5 MCG/ACT inhaler Inhale 2 puffs into the lungs in the morning and at bedtime. with spacer and rinse mouth afterwards.  . [DISCONTINUED] Tiotropium Bromide Monohydrate (SPIRIVA RESPIMAT) 1.25 MCG/ACT AERS Inhale 2 puffs into the lungs daily.  . [DISCONTINUED] Azelastine HCl 0.15 % SOLN Use 1-2 sprays per nostril twice a day as needed for drainage.  . [DISCONTINUED] Blood Glucose Monitoring Suppl (ACCU-CHEK AVIVA PLUS) w/Device KIT 1 Device by Does not apply route daily.  . [DISCONTINUED] glucose blood (ACCU-CHEK AVIVA PLUS) test strip 2 times daily  . [DISCONTINUED] Lancets (ACCU-CHEK SOFT TOUCH) lancets 2/day  . [DISCONTINUED] sulfamethoxazole-trimethoprim (BACTRIM DS) 800-160 MG tablet Take 1 tablet by mouth 2 (  two) times daily.   No facility-administered encounter medications on file as of 01/20/2021.     Review of Systems  Review of Systems  No chest pain with exertion, no orthopnea or PND.  Comprehensive review of systems otherwise negative.  Physical Exam  BP 114/70   Pulse 74   Temp (!) 97.3 F (36.3 C) (Temporal)   Ht '4\' 11"'  (1.499 m)   Wt 188 lb (85.3 kg)   SpO2 99% Comment: on RA  BMI 37.97 kg/m   Wt Readings from Last 5 Encounters:  01/20/21 188 lb (85.3 kg)  12/30/20 187 lb 12.8 oz (85.2 kg)  09/18/20 181 lb 6.4 oz (82.3 kg)  03/06/20 180 lb (81.6 kg)  02/26/20 183 lb 3.2 oz (83.1 kg)    BMI Readings from Last 5 Encounters:  01/20/21 37.97 kg/m  12/30/20 37.93 kg/m  09/18/20 35.43 kg/m  04/17/20 36.36 kg/m  03/06/20 36.36 kg/m     Physical Exam General: Well-appearing, sitting in chair Eyes: EOMI, icterus Neck: Supple, no JVD appreciated Respiratory: Cardiovascular: Regular rhythm, no murmurs Abdomen:  Obese, nondistended MSK: No synovitis, joint effusion Neuro: Normal gait, no weakness Psych: Normal mood, full affect   Assessment & Plan:   Asthma: Poorly controlled previously after COVID infection x 2.  Spirometry with significant bronchodilator response 2020.  Resumed Symbicort over the last few weeks. Using xopenex BID. Also on Spiriva.  She has been well taken care of by her allergist.  Symptoms better but not adequately controlled.  She has had eosinophil elevation up to 300 earlier in 2021.  Given her significant atopic, seasonal allergy symptoms, think would benefit from IgE and RAST testing.  Think she would likely respond well to biologic therapy, will need to see and discuss which is most appropriate.  Given her seasonal allergy symptoms, if IgE slightly elevated for Xolair.  Labs today.  Discussed combining current to inhalers to 1 inhaler via Judithann Sauger, she is open to this -suspect single co-pay will be cheaper than 2.  She will let us know if it is not cost effective.  Seasonal allergies: Per allergist, agree with current regimen.  Pre-operative Evaluation: ARISCAT score 3 - low risk for low risk procedure. Lungs clear, no wheeze. --If able, use nerve block --If able, avoid NM blockade --If intubated, check pre-intubation blood gasG and if elevated pCO2 extubate to BiPAP --If BiPAP needed check post-extubation blood gas to assess for CO2 retention --Bronchodilator nebs pre and post surgery  Return in about 3 months (around 04/20/2021).   Lanier Clam, MD 01/20/2021

## 2021-01-21 LAB — IGE: IgE (Immunoglobulin E), Serum: 55 kU/L (ref <=114)

## 2021-01-25 ENCOUNTER — Telehealth: Payer: Self-pay | Admitting: Pulmonary Disease

## 2021-01-25 LAB — ALLERGEN PROFILE, PERENNIAL ALLERGEN IGE

## 2021-01-25 NOTE — Telephone Encounter (Signed)
I printed last ov note and faxed to the fax number that was provided.

## 2021-01-25 NOTE — Telephone Encounter (Signed)
[facility] Dr Jacquelynn Cree office returning call to give fax # which is [phone].Caren Griffins

## 2021-01-25 NOTE — Telephone Encounter (Signed)
LMTCB for [name]  

## 2021-01-26 ENCOUNTER — Telehealth: Payer: Self-pay | Admitting: Pulmonary Disease

## 2021-01-27 NOTE — Telephone Encounter (Signed)
Left detailed message per DPR for pt that I faxed the ov note from 01/20/21 to Dr Jerl Santos via epic. I left the office call back number for pt to call back if she has any further questions.

## 2021-01-28 DIAGNOSIS — M94261 Chondromalacia, right knee: Secondary | ICD-10-CM | POA: Diagnosis not present

## 2021-01-28 DIAGNOSIS — S83211A Bucket-handle tear of medial meniscus, current injury, right knee, initial encounter: Secondary | ICD-10-CM | POA: Diagnosis not present

## 2021-01-28 DIAGNOSIS — Y999 Unspecified external cause status: Secondary | ICD-10-CM | POA: Diagnosis not present

## 2021-01-28 DIAGNOSIS — M2241 Chondromalacia patellae, right knee: Secondary | ICD-10-CM | POA: Diagnosis not present

## 2021-01-28 DIAGNOSIS — X58XXXA Exposure to other specified factors, initial encounter: Secondary | ICD-10-CM | POA: Diagnosis not present

## 2021-02-24 DIAGNOSIS — R531 Weakness: Secondary | ICD-10-CM | POA: Diagnosis not present

## 2021-02-24 DIAGNOSIS — M25661 Stiffness of right knee, not elsewhere classified: Secondary | ICD-10-CM | POA: Diagnosis not present

## 2021-03-01 ENCOUNTER — Ambulatory Visit: Payer: BC Managed Care – PPO | Admitting: Allergy

## 2021-03-03 DIAGNOSIS — R531 Weakness: Secondary | ICD-10-CM | POA: Diagnosis not present

## 2021-03-03 DIAGNOSIS — M25661 Stiffness of right knee, not elsewhere classified: Secondary | ICD-10-CM | POA: Diagnosis not present

## 2021-03-16 DIAGNOSIS — M25661 Stiffness of right knee, not elsewhere classified: Secondary | ICD-10-CM | POA: Diagnosis not present

## 2021-03-16 DIAGNOSIS — R531 Weakness: Secondary | ICD-10-CM | POA: Diagnosis not present

## 2021-03-18 DIAGNOSIS — R531 Weakness: Secondary | ICD-10-CM | POA: Diagnosis not present

## 2021-03-18 DIAGNOSIS — M25661 Stiffness of right knee, not elsewhere classified: Secondary | ICD-10-CM | POA: Diagnosis not present

## 2021-03-23 DIAGNOSIS — M25661 Stiffness of right knee, not elsewhere classified: Secondary | ICD-10-CM | POA: Diagnosis not present

## 2021-03-23 DIAGNOSIS — R531 Weakness: Secondary | ICD-10-CM | POA: Diagnosis not present

## 2021-03-24 ENCOUNTER — Other Ambulatory Visit: Payer: Self-pay

## 2021-03-24 ENCOUNTER — Emergency Department (HOSPITAL_COMMUNITY)
Admission: EM | Admit: 2021-03-24 | Discharge: 2021-03-24 | Disposition: A | Discharge status: Home / Self Care | Payer: BC Managed Care – PPO | Attending: Emergency Medicine | Admitting: Emergency Medicine

## 2021-03-24 ENCOUNTER — Encounter (HOSPITAL_COMMUNITY): Payer: Self-pay

## 2021-03-24 DIAGNOSIS — J4540 Moderate persistent asthma, uncomplicated: Secondary | ICD-10-CM | POA: Insufficient documentation

## 2021-03-24 DIAGNOSIS — Z7951 Long term (current) use of inhaled steroids: Secondary | ICD-10-CM | POA: Insufficient documentation

## 2021-03-24 DIAGNOSIS — R112 Nausea with vomiting, unspecified: Secondary | ICD-10-CM | POA: Insufficient documentation

## 2021-03-24 DIAGNOSIS — R197 Diarrhea, unspecified: Secondary | ICD-10-CM | POA: Insufficient documentation

## 2021-03-24 DIAGNOSIS — I10 Essential (primary) hypertension: Secondary | ICD-10-CM | POA: Diagnosis not present

## 2021-03-24 DIAGNOSIS — R059 Cough, unspecified: Secondary | ICD-10-CM | POA: Insufficient documentation

## 2021-03-24 DIAGNOSIS — R0981 Nasal congestion: Secondary | ICD-10-CM | POA: Diagnosis not present

## 2021-03-24 DIAGNOSIS — Z20822 Contact with and (suspected) exposure to covid-19: Secondary | ICD-10-CM | POA: Diagnosis not present

## 2021-03-24 DIAGNOSIS — E119 Type 2 diabetes mellitus without complications: Secondary | ICD-10-CM | POA: Insufficient documentation

## 2021-03-24 DIAGNOSIS — R000 Tachycardia, unspecified: Secondary | ICD-10-CM | POA: Insufficient documentation

## 2021-03-24 DIAGNOSIS — R509 Fever, unspecified: Secondary | ICD-10-CM | POA: Insufficient documentation

## 2021-03-24 DIAGNOSIS — R1084 Generalized abdominal pain: Secondary | ICD-10-CM | POA: Insufficient documentation

## 2021-03-24 LAB — COMPREHENSIVE METABOLIC PANEL WITH GFR
ALT: 122 U/L — ABNORMAL HIGH (ref 0–44)
AST: 128 U/L — ABNORMAL HIGH (ref 15–41)
Albumin: 4.5 g/dL (ref 3.5–5.0)
Alkaline Phosphatase: 95 U/L (ref 38–126)
Anion gap: 15 (ref 5–15)
BUN: 16 mg/dL (ref 6–20)
CO2: 23 mmol/L (ref 22–32)
Calcium: 9.8 mg/dL (ref 8.9–10.3)
Chloride: 102 mmol/L (ref 98–111)
Creatinine, Ser: 0.92 mg/dL (ref 0.44–1.00)
GFR, Estimated: >60 mL/min
Glucose, Bld: 328 mg/dL — ABNORMAL HIGH (ref 70–99)
Potassium: 3.9 mmol/L (ref 3.5–5.1)
Sodium: 140 mmol/L (ref 135–145)
Total Bilirubin: 0.8 mg/dL (ref 0.3–1.2)
Total Protein: 8.3 g/dL — ABNORMAL HIGH (ref 6.5–8.1)

## 2021-03-24 LAB — CBC
HCT: 48.5 % — ABNORMAL HIGH (ref 36.0–46.0)
Hemoglobin: 15.8 g/dL — ABNORMAL HIGH (ref 12.0–15.0)
MCH: 28.6 pg (ref 26.0–34.0)
MCHC: 32.6 g/dL (ref 30.0–36.0)
MCV: 87.9 fL (ref 80.0–100.0)
Platelets: 269 10*3/uL (ref 150–400)
RBC: 5.52 MIL/uL — ABNORMAL HIGH (ref 3.87–5.11)
RDW: 13.8 % (ref 11.5–15.5)
WBC: 8 10*3/uL (ref 4.0–10.5)
nRBC: 0 % (ref 0.0–0.2)

## 2021-03-24 LAB — URINALYSIS, ROUTINE W REFLEX MICROSCOPIC
Bilirubin Urine: NEGATIVE
Glucose, UA: >=500 mg/dL — AB
Hgb urine dipstick: NEGATIVE
Ketones, ur: 5 mg/dL — AB
Leukocytes,Ua: NEGATIVE
Nitrite: NEGATIVE
Protein, ur: 30 mg/dL — AB
Specific Gravity, Urine: 1.039 — ABNORMAL HIGH (ref 1.005–1.030)
pH: 5 (ref 5.0–8.0)

## 2021-03-24 LAB — LIPASE, BLOOD: Lipase: 25 U/L (ref 11–51)

## 2021-03-24 MED ORDER — SODIUM CHLORIDE 0.9 % IV BOLUS
1000.0000 mL | Freq: Once | INTRAVENOUS | Status: AC
  Administered 2021-03-24: 1000 mL via INTRAVENOUS

## 2021-03-24 MED ORDER — ONDANSETRON 4 MG PO TBDP: 4.0000 mg | ORAL_TABLET | Freq: Three times a day (TID) | ORAL | 0 refills | Status: AC | PRN

## 2021-03-24 MED ORDER — ACETAMINOPHEN 500 MG PO TABS
1000.0000 mg | ORAL_TABLET | Freq: Once | ORAL | Status: AC
  Administered 2021-03-24: 1000 mg via ORAL
  Filled 2021-03-24: qty 2

## 2021-03-24 MED ORDER — ONDANSETRON HCL 4 MG/2ML IJ SOLN
4.0000 mg | Freq: Once | INTRAMUSCULAR | Status: AC
  Administered 2021-03-24: 4 mg via INTRAVENOUS
  Filled 2021-03-24: qty 2

## 2021-03-24 NOTE — ED Triage Notes (Signed)
Pt states since 0400 this morning, she has experienced N/V/D. Pt states she is unable to keep fluids down. Pt states her close friend is sick with the same symptoms. Pt states she has experienced weakness and fatigue, Pt is A&Ox4.

## 2021-03-24 NOTE — Discharge Instructions (Signed)
Your laboratory results are within normal limits today.  There is an elevation of your liver enzymes, you may benefit from having this lab recheck by your primary care physician.  If you experience any vomiting, worsening pain, worsening symptoms please return to the emergency department.

## 2021-03-24 NOTE — ED Provider Notes (Signed)
[facility]-EMERGENCY DEPT Provider Note   CSN: [id number] Arrival date & time: 03/24/21  1635     History Chief Complaint  Patient presents with  . Emesis  . Nausea  . Diarrhea    [name] is a 55 y.o. female.  55 y.o female with a PMH of DM, HTN presents to the ED with a chief complaint of nausea, vomiting, diarrhea since 4 AM this morning.  Patient reports her and her friend both [facility] last [facility], developed sudden onset of nonbilious, nonbloody emesis, has had about 7 episodes.  She also endorses chills, states that she has had some nasal congestion and cough but she attributes this to "I have really bad allergies ".  She also endorses abdominal pain in generalized area without any focal point of tenderness.  Symptoms are exacerbated with emesis, without alleviating factors.  She reports feeling as ill as when she had a cholecystectomy several years ago.  He has tried some ginger ale however there has been no improvement in her symptoms.  He arrived in the ED febrile with a temperature of 100.2.  No recent travel, surgeries, chest pain, shortness of breath.    The history is provided by the patient.  Abdominal Pain Pain location:  Generalized Pain quality: dull   Pain radiates to:  Does not radiate Pain severity:  Mild Onset quality:  Sudden Duration:  10 hours Timing:  Constant Progression:  Worsening Chronicity:  New Context: eating, previous surgery, sick contacts and suspicious food intake   Context: not alcohol use, not diet changes, not laxative use and not recent illness   Associated symptoms: fever, nausea and vomiting   Associated symptoms: no chest pain, no dysuria, no shortness of breath and no sore throat        Past Medical History:  Diagnosis Date  . Allergy   . Arthritis   . Atypical chest pain 10/25/2019  . Diabetes mellitus without complication (HCC)    2016  . Essential hypertension 10/25/2019  . Family history of  anesthesia complication    "Mama did; she has alzheimer's; any anesthesia puts her in the twilight zone"  . Gallstones   . GERD (gastroesophageal reflux disease)   . Migraine    "cluster migraine"  . Not well controlled moderate persistent asthma 12/30/2020  . Sciatic nerve injury    "right side"    Patient Active Problem List   Diagnosis Date Noted  . Not well controlled moderate persistent asthma 12/30/2020  . Bee sting reaction 09/18/2020  . Hyperlipidemia associated with type 2 diabetes mellitus (HCC) 09/08/2020  . Adverse food reaction 11/25/2019  . Essential hypertension 10/25/2019  . Atypical chest pain 10/25/2019  . Allergic conjunctivitis of both eyes 09/30/2019  . Seasonal allergic rhinitis due to pollen 07/22/2019  . Drug reaction 06/20/2019  . Diabetes mellitus type 2 in obese (HCC) 05/04/2017  . Class 2 obesity with body mass index (BMI) of 38.0 to 38.9 in adult 05/04/2017  . Calculus of gallbladder with acute cholecystitis 08/31/2014    Past Surgical History:  Procedure Laterality Date  . ANTERIOR CRUCIATE LIGAMENT REPAIR Right 1996  . CHOLECYSTECTOMY N/A 08/31/2014   Procedure: LAPAROSCOPIC CHOLECYSTECTOMY WITH INTRAOPERATIVE CHOLANGIOGRAM;  Surgeon: Gaynelle AduEric Wilson, MD;  [facility];  Service: General;  Laterality: N/A;  . KNEE ARTHROSCOPY Right 1998   with ACL reconstruction  . LAPAROSCOPIC CHOLECYSTECTOMY  08/31/2014  . SINOSCOPY  1998  . TONSILLECTOMY  ~ 1978  OB History   No obstetric history on file.     Family History  Problem Relation Age of Onset  . Dementia Mother   . Stroke Mother   . Diabetes Mother   . Cancer Father        prostate  . Heart attack Father   . Diabetes Daughter   . Hyperlipidemia Daughter   . Hypertension Daughter   . Heart attack Maternal Grandfather   . Allergic rhinitis Neg Hx   . Angioedema Neg Hx   . Asthma Neg Hx   . Atopy Neg Hx   . Eczema Neg Hx   . Immunodeficiency Neg Hx   . Urticaria Neg Hx     Social  History   Tobacco Use  . Smoking status: Never Smoker  . Smokeless tobacco: Never Used  Vaping Use  . Vaping Use: Never used  Substance Use Topics  . Alcohol use: No  . Drug use: No    Home Medications Prior to Admission medications   Medication Sig Start Date End Date Taking? Authorizing Provider  ondansetron (ZOFRAN ODT) 4 MG disintegrating tablet Take 1 tablet (4 mg total) by mouth every 8 (eight) hours as needed for up to 5 days for nausea or vomiting. 03/24/21 03/29/21 Yes Adyn Hoes, PA-C  albuterol (PROAIR HFA) 108 (90 Base) MCG/ACT inhaler INHALE 2 PUFFS INTO THE LUNGS EVERY 6 HOURS AS NEEDED FOR WHEEZING OR SHORTNESS OF BREATH 02/26/20   Ellamae Sia, DO  Budeson-Glycopyrrol-Formoterol (BREZTRI AEROSPHERE) 160-9-4.8 MCG/ACT AERO Inhale 2 puffs into the lungs in the morning and at bedtime. 01/20/21   Hunsucker, Lesia Sago, MD  Budeson-Glycopyrrol-Formoterol (BREZTRI AEROSPHERE) 160-9-4.8 MCG/ACT AERO Inhale 2 puffs into the lungs 2 (two) times daily. 01/20/21   Hunsucker, Lesia Sago, MD  EPINEPHrine 0.3 mg/0.3 mL IJ SOAJ injection Inject 0.3 mg into the muscle as needed for anaphylaxis. 01/08/21   Ellamae Sia, DO  fluticasone (FLONASE) 50 MCG/ACT nasal spray Place 2 sprays into both nostrils 2 (two) times a day.    [provider]  levalbuterol (XOPENEX) 1.25 MG/3ML nebulizer solution USE 1 VIAL VIA NEBULIZER EVERY 6 HOURS AS NEEDED FOR WHEEZING OR SHORTNESS OF BREATH AND COUGH 01/15/21   Ellamae Sia, DO  Magnesium Gluconate 500 (27 Mg) MG TABS Take by mouth.    [provider]  montelukast (SINGULAIR) 10 MG tablet TAKE 1 TABLET(10 MG) BY MOUTH AT BEDTIME 12/21/20   [place], Betty G, MD  Respiratory Therapy Supplies (ONE FLOW SPIROMETER) DEVI 1 Device by Does not apply route 3 (three) times daily as needed. 01/29/20   [place], Betty G, MD  sertraline (ZOLOFT) 25 MG tablet TAKE 1 TABLET(25 MG) BY MOUTH DAILY 10/26/20   [place], Betty G, MD  SUMAtriptan (IMITREX) 50 MG tablet Take  50 mg by mouth every 2 (two) hours as needed for migraine. May repeat in 2 hours if headache persists or recurs.    [provider]    Allergies    Bee venom, Citrus, Penicillins, Topiramate, and Vicodin [hydrocodone-acetaminophen]  Review of Systems   Review of Systems  Constitutional: Positive for fever.  HENT: Negative for sore throat.   Respiratory: Negative for shortness of breath.   Cardiovascular: Negative for chest pain.  Gastrointestinal: Positive for abdominal pain, nausea and vomiting.  Genitourinary: Negative for decreased urine volume, difficulty urinating, dysuria, flank pain and urgency.  Musculoskeletal: Negative for back pain.  Skin: Negative for pallor and wound.  Neurological: Negative for light-headedness and headaches.  All other systems reviewed and are negative.   Physical Exam Updated Vital Signs BP 108/79 (BP Location: Right Arm)   Pulse (!) 107   Temp 99.2 F (37.3 C) (Oral)   Resp 19   Ht 4\' 11"  (1.499 m)   Wt 81.6 kg   SpO2 93%   BMI 36.36 kg/m   Physical Exam Vitals and nursing note reviewed.  Constitutional:      Appearance: Normal appearance.  HENT:     Head: Normocephalic and atraumatic.     Nose: Nose normal.     Mouth/Throat:     Mouth: Mucous membranes are moist.  Eyes:     Pupils: Pupils are equal, round, and reactive to light.  Cardiovascular:     Rate and Rhythm: Tachycardia present.  Pulmonary:     Effort: Pulmonary effort is normal.     Breath sounds: No wheezing or rhonchi.  Abdominal:     General: Abdomen is flat. Bowel sounds are decreased.     Palpations: Abdomen is soft. There is no mass.     Tenderness: There is abdominal tenderness. There is no right CVA tenderness or left CVA tenderness.     Hernia: No hernia is present.    Musculoskeletal:     Cervical back: Normal range of motion and neck supple.  Skin:    General: Skin is warm and dry.  Neurological:     Mental Status: She is alert and oriented to  person, place, and time.     ED Results / Procedures / Treatments   Labs (all labs ordered are listed, but only abnormal results are displayed) Labs Reviewed  COMPREHENSIVE METABOLIC PANEL - Abnormal; Notable for the following components:      Result Value   Glucose, Bld 328 (*)    Total Protein 8.3 (*)    AST 128 (*)    ALT 122 (*)    All other components within normal limits  CBC - Abnormal; Notable for the following components:   RBC 5.52 (*)    Hemoglobin 15.8 (*)    HCT 48.5 (*)    All other components within normal limits  SARS CORONAVIRUS 2 (TAT 6-24 HRS)  LIPASE, BLOOD  URINALYSIS, ROUTINE W REFLEX MICROSCOPIC    EKG None  Radiology No results found.  Procedures Procedures   Medications Ordered in ED Medications  sodium chloride 0.9 % bolus 1,000 mL (1,000 mLs Intravenous New Bag/Given 03/24/21 1828)  ondansetron (ZOFRAN) injection 4 mg (4 mg Intravenous Given 03/24/21 1828)  acetaminophen (TYLENOL) tablet 1,000 mg (1,000 mg Oral Given 03/24/21 1828)    ED Course  I have reviewed the triage vital signs and the nursing notes.  Pertinent labs & imaging results that were available during my care of the patient were reviewed by me and considered in my medical decision making (see chart for details).    MDM Rules/Calculators/A&P   Presents to the ED with chief complaint of nausea, vomiting, diarrhea after eating at [employer].  Reports she has had multiple episodes of vomiting, or feels feeling overall weak and fatigued.  Vitals on arrival remarkable for tachycardia and fever with a temperature of 100.2, denies any URI symptoms such as cough, runny nose, other complaints.  Does report her friend and roommate is here with the same symptoms and arrived via EMS.  During evaluation patient is overall ill-appearing, tachycardia present along with fever, provided with Tylenol to help with symptomatic control.  Abdomen is soft, tender to palpation in  generalized  region without any focal point of tenderness.  No flank pain or CVA tenderness.  Lungs are clear to auscultation without any nasal congestion.  She does report suffering from allergies at this point and has had some increase in sinus pressure.   Revision of her labs reveal a CBC without any leukocytosis, hemoglobin is within normal limits.  CMP without any electrolyte derangement despite vomiting, glucose is slightly elevated she does have a history of diabetes, LFTs are elevated on today's visit however she does have a prior history of a cholecystectomy several years ago.  She was provided with Bentyl, Zofran, bolus to help with symptomatic control.  Serious of abdominal exams have been performed abdomen remains soft, she is tolerating p.o. adequately.  We did obtain a Covid test.  Suspect a viral enteritis this patient and roommate have the same symptoms after eating at a fast food restaurant.  There has been no blood in her stool, no hematemesis, no other symptoms at this time.  She will go home on a short prescription of Zofran to help with nausea and vomiting.  Vitals are within normal limits, temperature has improved to 99.2, patient stable for discharge at this time.   Portions of this note were generated with Scientist, clinical (histocompatibility and immunogenetics). Dictation errors may occur despite best attempts at proofreading.  Final Clinical Impression(s) / ED Diagnoses Final diagnoses:  Nausea vomiting and diarrhea    Rx / DC Orders ED Discharge Orders         Ordered    ondansetron (ZOFRAN ODT) 4 MG disintegrating tablet  Every 8 hours PRN        03/24/21 2141           Claude Manges, PA-C 03/24/21 2145    Gerhard Munch, MD 03/26/21 2319

## 2021-03-25 LAB — SARS CORONAVIRUS 2 (TAT 6-24 HRS): SARS Coronavirus 2: NEGATIVE

## 2021-04-09 ENCOUNTER — Other Ambulatory Visit: Payer: Self-pay | Admitting: Family Medicine

## 2021-04-09 DIAGNOSIS — F419 Anxiety disorder, unspecified: Secondary | ICD-10-CM

## 2021-04-19 ENCOUNTER — Telehealth: Payer: Self-pay | Admitting: Cardiovascular Disease

## 2021-04-19 NOTE — Telephone Encounter (Signed)
Left message to call back  

## 2021-04-19 NOTE — Telephone Encounter (Signed)
Pt c/o of Chest Pain: STAT if CP now or developed within 24 hours  1. Are you having CP right now? NO  2. Are you experiencing any other symptoms (ex. SOB, nausea, vomiting, sweating)? Sharp pains running threw her left and right breast   3. How long have you been experiencing CP? 3 weeks    4. Is your CP continuous or coming and going? Coming and going   5. Have you taken Nitroglycerin? no   PT is calling with concerns about having chest pains with sharp pains shooting threw her breasts.PT also states she has a history of heart attacks and strokes.Please advise  ?

## 2021-04-20 NOTE — Telephone Encounter (Signed)
Returned call to patient of Dr. Duke Salvia She reports sharp pains that runs thru her chest - right and left - thru breasts This occurs off and on, lasting a few seconds and then resolves She does not attribute pain to exercise, rest, caffeine, moving upper extremities but does report the pain occurs most often after eating fried foods She takes ASA 81mg  but if she forgets she will have stronger pains She reports no other concerns  Her last visit was 02/2020 - was PRN follow up She was seen in 2020 and echo was ordered - NL LVEF  Advised does not sound like cardiac pains and will notify MD to review and contact her with any advice

## 2021-04-20 NOTE — Telephone Encounter (Signed)
Pt is returning call.  

## 2021-04-26 NOTE — Telephone Encounter (Signed)
Left detailed message, ok per DPR   Delsa Bern K - 04/19/2021 1:47 PM Chilton Si, MD [phone])  Sent: Mon Apr 26, 2021 5:52 PM  To: Lindell Spar, RN  Cc: Burnell Blanks, LPN       Message  Agree. Does not sound cardiac. She can be seen next available.

## 2021-04-29 DIAGNOSIS — G44009 Cluster headache syndrome, unspecified, not intractable: Secondary | ICD-10-CM | POA: Diagnosis not present

## 2021-04-30 ENCOUNTER — Encounter: Payer: Self-pay | Admitting: Allergy

## 2021-04-30 ENCOUNTER — Encounter: Payer: BC Managed Care – PPO | Admitting: Allergy

## 2021-04-30 ENCOUNTER — Other Ambulatory Visit: Payer: Self-pay

## 2021-04-30 NOTE — Progress Notes (Signed)
Patient left before being seen as she had to get her dogs and had a headache.  This encounter was created in error - please disregard.

## 2021-08-16 ENCOUNTER — Ambulatory Visit (HOSPITAL_BASED_OUTPATIENT_CLINIC_OR_DEPARTMENT_OTHER): Payer: BC Managed Care – PPO | Admitting: Cardiovascular Disease

## 2021-10-22 ENCOUNTER — Ambulatory Visit (HOSPITAL_BASED_OUTPATIENT_CLINIC_OR_DEPARTMENT_OTHER): Payer: Self-pay | Admitting: Cardiovascular Disease

## 2021-12-07 IMAGING — DX DG CHEST 2V
2 series · 2 of 2 positions shown · non-contrast
Comparison: Prior chest radiographs 09/03/2014.

CLINICAL DATA: Dyspnea, unspecified type. Mild reactive airway
disease, unspecified whether persistent. Cough and wheezing for a
few weeks after WHDUG-W1 infection (recovered) additional provided
by technologist: Cough and wheezing for a few weeks after WHDUG-W1
infection (recovered).

EXAM:
CHEST - 2 VIEW

[chest pa]
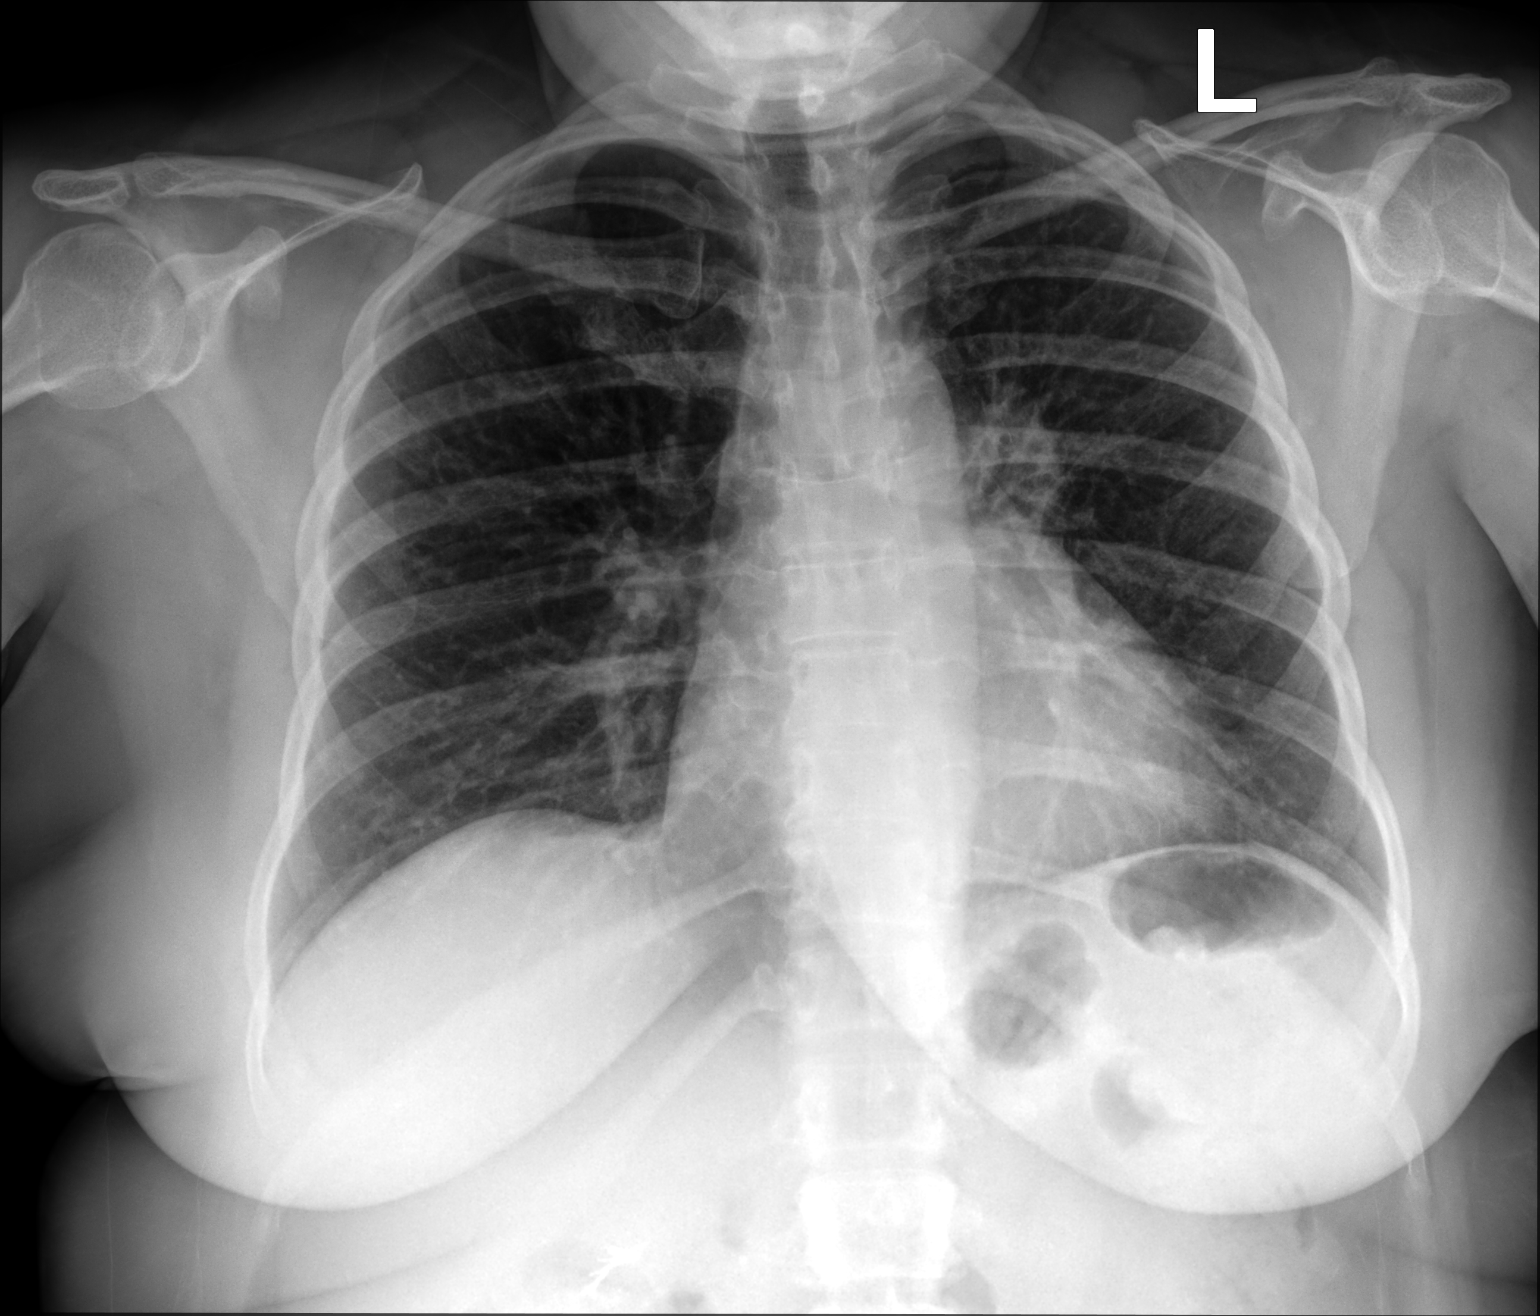

[chest lat]
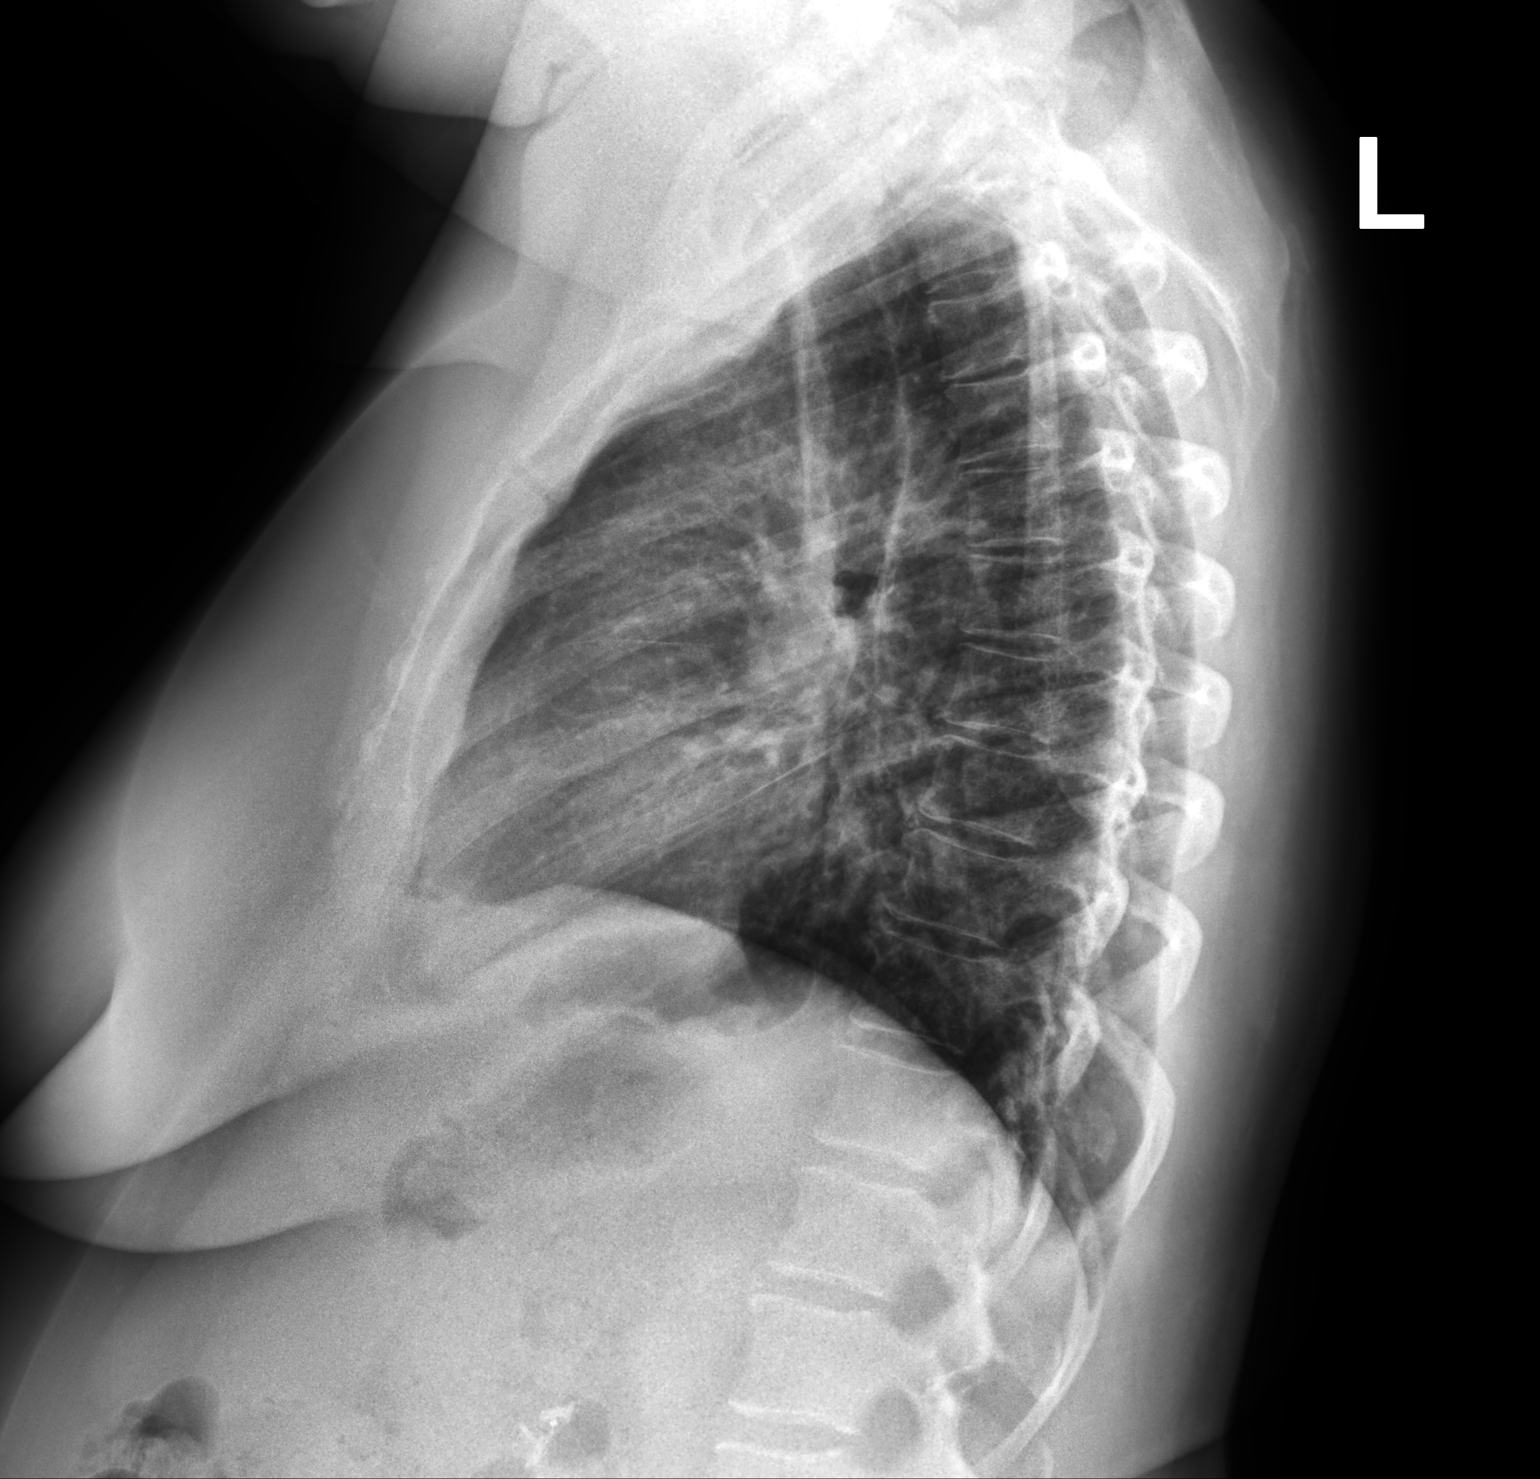

[2 of 2 positions shown; findings below may reference images not displayed]

FINDINGS: Heart size within normal limits. Nonspecific prominence of the left
hilum, stable dating back to prior radiographs 09/02/2014. No
evidence of airspace consolidation with the lungs. There are mild
bronchitic changes. No evidence of pleural effusion or pneumothorax.
No acute bony abnormality. Surgical clips within the right upper
quadrant of the abdomen.
IMPRESSION: Mild bronchitic changes.

No evidence of airspace consolidation.

Nonspecific prominence of the left hilum, stable dating back to
radiographs 09/02/2014.

## 2022-03-14 DIAGNOSIS — M25561 Pain in right knee: Secondary | ICD-10-CM | POA: Diagnosis not present

## 2022-04-22 ENCOUNTER — Encounter: Payer: Self-pay | Admitting: Nurse Practitioner

## 2022-04-22 ENCOUNTER — Other Ambulatory Visit (INDEPENDENT_AMBULATORY_CARE_PROVIDER_SITE_OTHER): Payer: BC Managed Care – PPO

## 2022-04-22 ENCOUNTER — Ambulatory Visit: Payer: BC Managed Care – PPO | Admitting: Nurse Practitioner

## 2022-04-22 ENCOUNTER — Other Ambulatory Visit: Payer: Self-pay | Admitting: Pulmonary Disease

## 2022-04-22 ENCOUNTER — Ambulatory Visit (INDEPENDENT_AMBULATORY_CARE_PROVIDER_SITE_OTHER): Payer: BC Managed Care – PPO

## 2022-04-22 VITALS — BP 126/86 | HR 72 | Temp 98.0°F | Ht 59.0 in | Wt 179.4 lb

## 2022-04-22 DIAGNOSIS — J301 Allergic rhinitis due to pollen: Secondary | ICD-10-CM | POA: Diagnosis not present

## 2022-04-22 DIAGNOSIS — B9689 Other specified bacterial agents as the cause of diseases classified elsewhere: Secondary | ICD-10-CM

## 2022-04-22 DIAGNOSIS — J4541 Moderate persistent asthma with (acute) exacerbation: Secondary | ICD-10-CM

## 2022-04-22 DIAGNOSIS — J45909 Unspecified asthma, uncomplicated: Secondary | ICD-10-CM | POA: Diagnosis not present

## 2022-04-22 DIAGNOSIS — J019 Acute sinusitis, unspecified: Secondary | ICD-10-CM | POA: Diagnosis not present

## 2022-04-22 DIAGNOSIS — R059 Cough, unspecified: Secondary | ICD-10-CM | POA: Diagnosis not present

## 2022-04-22 DIAGNOSIS — J4540 Moderate persistent asthma, uncomplicated: Secondary | ICD-10-CM

## 2022-04-22 LAB — CBC WITH DIFFERENTIAL/PLATELET
Basophils Absolute: 0.1 10*3/uL (ref 0.0–0.1)
Basophils Relative: 0.9 % (ref 0.0–3.0)
Eosinophils Absolute: 0.2 10*3/uL (ref 0.0–0.7)
Eosinophils Relative: 2.2 % (ref 0.0–5.0)
HCT: 42.4 % (ref 36.0–46.0)
Hemoglobin: 14.2 g/dL (ref 12.0–15.0)
Lymphocytes Relative: 45.1 % (ref 12.0–46.0)
Lymphs Abs: 3.4 10*3/uL (ref 0.7–4.0)
MCHC: 33.4 g/dL (ref 30.0–36.0)
MCV: 87.7 fl (ref 78.0–100.0)
Monocytes Absolute: 1.1 10*3/uL — ABNORMAL HIGH (ref 0.1–1.0)
Monocytes Relative: 14.2 % — ABNORMAL HIGH (ref 3.0–12.0)
Neutro Abs: 2.8 10*3/uL (ref 1.4–7.7)
Neutrophils Relative %: 37.6 % — ABNORMAL LOW (ref 43.0–77.0)
Platelets: 193 10*3/uL (ref 150.0–400.0)
RBC: 4.84 Mil/uL (ref 3.87–5.11)
RDW: 13.6 % (ref 11.5–15.5)
WBC: 7.5 10*3/uL (ref 4.0–10.5)

## 2022-04-22 MED ORDER — DOXYCYCLINE HYCLATE 100 MG PO TABS: 100.0000 mg | ORAL_TABLET | Freq: Two times a day (BID) | ORAL | 0 refills | Status: AC

## 2022-04-22 MED ORDER — PREDNISONE 20 MG PO TABS: 40.0000 mg | ORAL_TABLET | Freq: Every day | ORAL | 0 refills | Status: AC

## 2022-04-22 MED ORDER — AZELASTINE HCL 0.1 % NA SOLN: 2.0000 | Freq: Two times a day (BID) | NASAL | 12 refills | Status: AC

## 2022-04-22 MED ORDER — PROMETHAZINE-CODEINE 6.25-10 MG/5ML PO SYRP: 5.0000 mL | ORAL_SOLUTION | Freq: Four times a day (QID) | ORAL | 0 refills | Status: AC | PRN

## 2022-04-22 MED ORDER — BENZONATATE 200 MG PO CAPS: 200.0000 mg | ORAL_CAPSULE | Freq: Three times a day (TID) | ORAL | 1 refills | Status: AC | PRN

## 2022-04-22 NOTE — Assessment & Plan Note (Addendum)
Persistent symptoms for approximately 3 weeks.  Will treat for bacterial infection with doxycycline course.  Advised to continue Zyrtec and Flonase.  We will add on Astelin nasal spray for better postnasal drainage control. ?

## 2022-04-22 NOTE — Assessment & Plan Note (Addendum)
Continue trigger prevention with Claritin.  CBC with differential and IgE to assess for need for Singulair.  Follow-up with allergist. ?

## 2022-04-22 NOTE — Patient Instructions (Addendum)
Continue Breztri 2 puffs twice daily.  Brush tongue, rinse mouth and gargle afterwards ?Continue Albuterol inhaler 2 puffs every 6 hours as needed for shortness of breath or wheezing. Notify if symptoms persist despite rescue inhaler/neb use. ?Continue Flonase nasal spray 2 sprays each nostril twice daily ?Continue Zyrtec 10 mg daily for allergies  ?Continue Mucinex DM 600 mg Twice daily for cough/congestion ? ?-Astelin 2 sprays each nostril Twice daily for nasal congestion/drainage  ?-Tessalon perles 1 capsule Three times a day for cough  ?-Phenergan with codeine cough syrup 5 mL every 6 hours as needed for cough ?-Doxycycline 1 tab twice daily for 7 days.  Take with foo ?-Prednisone 40 mg (2 tabs) daily for 5 days. Take in AM with food.  ? ?Labs today-CBC with differential, IgE ? ?Chest x-ray today ? ?Follow up in one month with Dr. Judeth Horn. If symptoms do not improve or worsen, please contact office for sooner follow up or seek emergency care. ?

## 2022-04-22 NOTE — Progress Notes (Addendum)
? ?@Patient  ID: , female    DOB: 1966/11/09, 56 y.o.   MRN: [id number] ? ?Chief Complaint  ?Patient presents with  ? Follow-up  ?  Patient has had a cough for a couple of weeks. Says nothing is working to clear her up.   ? ? ?Referring provider: ?[id number], Betty G, MD ? ?HPI: ?56 year old female, never smoker followed for moderate persistent asthma and allergic rhinitis.  She patient of Dr. 57 and last seen in office 01/20/2021.  She is also followed by allergy/asthma.  Past medical history significant for hypertension, migraine, GERD, DM2, HLD, obesity. ? ?TEST/EVENTS:  ?06/19/2019 PFTs: Interpreted as fixed obstruction that resolves after bronchodilator with significant bronchodilator response with 500 cc increase in FVC ?12/31/2020 spirometry normal ?01/20/2021: Eosinophils 0.2, IgE normal, RAST negative ?02/12/2020 CXR 2 view: Nonspecific prominence of the left hilum, stable dating back to 2015.  There are mild bronchitic changes present.  There is no evidence of airspace consolidation within the lungs. ? ?01/20/2021: OV with Dr. 01/22/2021.  Seen for new consult upon request of allergist to review wheezing and asthma.  Overall felt like she was doing okay.  She had COVID about a year ago and then had a second bout recently.  Developed wheezing, chest tightness and shortness of breath afterwards.  Does feel like her breathing and symptoms worsen with changes in weather particularly cold weather.  Using Symbicort twice daily, which she resumed a few weeks ago, and lev albuterol as needed.  Also on Spiriva.  Having some improvement in symptoms with this.  On daily antihistamine, nasal spray and Singulair for seasonal allergies.  Changed to Judeth Horn.  Advised checking IgE and RAST testing as well as eosinophils.  May be candidate for biologic therapy.  Follow-up in 3 months.  Surgical clearance provided prior to knee scope. ? ?04/22/2022: Today-acute visit ?Patient presents today for persistent cough over  the last 3 weeks.  Cough is primarily dry and hacking; however she will occasionally get up some clear to white sputum.  Having some increased shortness of breath with exertion and notable wheezing.  She also had increased purulent nasal drainage and congestion, which has not improved despite Zyrtec and Flonase.  She denies any fevers, interim sick exposures, hemoptysis, weight loss, lower extremity edema.  She continues on Breztri 2 puffs twice daily.  Has been using her levalbuterol neb without much relief.  She no longer takes Singulair at bedtime.  Reports that she was told that she did not need it at some point.  Has not seen allergist or 04/24/2022 since early 2022. ? ?Allergies  ?Allergen Reactions  ? Bee Venom Anaphylaxis  ? Citrus Hives  ? Topiramate   ? Vicodin [Hydrocodone-Acetaminophen] Hives  ? ? ?Immunization History  ?Administered Date(s) Administered  ? Influenza,inj,Quad PF,6+ Mos 09/21/2019  ? Influenza-Unspecified 09/27/2021  ? PFIZER(Purple Top)SARS-COV-2 Vaccination 03/11/2020, 04/01/2020, 10/10/2020  ? 10/12/2020 6yrs & up 09/27/2021  ? Tdap 10/23/2015  ? ? ?Past Medical History:  ?Diagnosis Date  ? Allergy   ? Arthritis   ? Atypical chest pain 10/25/2019  ? Diabetes mellitus without complication (HCC)   ? 2016  ? Essential hypertension 10/25/2019  ? Family history of anesthesia complication   ? "Mama did; she has alzheimer's; any anesthesia puts her in the twilight zone"  ? Gallstones   ? GERD (gastroesophageal reflux disease)   ? Migraine   ? "cluster migraine"  ? Not well controlled moderate persistent asthma 12/30/2020  ?  Sciatic nerve injury   ? "right side"  ? ? ?Tobacco History: ?Social History  ? ?Tobacco Use  ?Smoking Status Never  ?Smokeless Tobacco Never  ? ?Counseling given: Not Answered ? ? ?Outpatient Medications Prior to Visit  ?Medication Sig Dispense Refill  ? albuterol (PROAIR HFA) 108 (90 Base) MCG/ACT inhaler INHALE 2 PUFFS INTO THE LUNGS EVERY 6 HOURS  AS NEEDED FOR WHEEZING OR SHORTNESS OF BREATH 18 g 2  ? Budeson-Glycopyrrol-Formoterol (BREZTRI AEROSPHERE) 160-9-4.8 MCG/ACT AERO Inhale 2 puffs into the lungs in the morning and at bedtime. 10.7 g 11  ? Budeson-Glycopyrrol-Formoterol (BREZTRI AEROSPHERE) 160-9-4.8 MCG/ACT AERO Inhale 2 puffs into the lungs 2 (two) times daily. 11.8 g 0  ? EPINEPHrine 0.3 mg/0.3 mL IJ SOAJ injection Inject 0.3 mg into the muscle as needed for anaphylaxis. 1 each 2  ? fluticasone (FLONASE) 50 MCG/ACT nasal spray Place 2 sprays into both nostrils 2 (two) times a day.    ? levalbuterol (XOPENEX) 1.25 MG/3ML nebulizer solution USE 1 VIAL VIA NEBULIZER EVERY 6 HOURS AS NEEDED FOR WHEEZING OR SHORTNESS OF BREATH AND COUGH 75 mL 2  ? Respiratory Therapy Supplies (ONE FLOW SPIROMETER) DEVI 1 Device by Does not apply route 3 (three) times daily as needed. 1 each 0  ? sertraline (ZOLOFT) 25 MG tablet TAKE 1 TABLET(25 MG) BY MOUTH DAILY 90 tablet 1  ? SUMAtriptan (IMITREX) 50 MG tablet Take 50 mg by mouth every 2 (two) hours as needed for migraine. May repeat in 2 hours if headache persists or recurs.    ? Magnesium Gluconate 500 (27 Mg) MG TABS Take by mouth. (Patient not taking: Reported on 04/22/2022)    ? montelukast (SINGULAIR) 10 MG tablet TAKE 1 TABLET(10 MG) BY MOUTH AT BEDTIME (Patient not taking: Reported on 04/22/2022) 30 tablet 3  ? ?No facility-administered medications prior to visit.  ? ? ? ?Review of Systems:  ? ?Constitutional: No weight loss or gain, night sweats, fevers, chills, fatigue, or lassitude. ?HEENT: No headaches, difficulty swallowing, tooth/dental problems, or sore throat. No sneezing, itching, ear ache + nasal congestion/drainage, postnasal drip ?CV:  No chest pain, orthopnea, PND, swelling in lower extremities, anasarca, dizziness, palpitations, syncope ?Resp: +shortness of breath with exertion; paroxysmal cough, minimally productive; occasional wheeze. No hemoptysis. No chest wall deformity ?GI:  No heartburn,  indigestion, abdominal pain, nausea, vomiting, diarrhea, change in bowel habits, loss of appetite, bloody stools.  ?Skin: No rash, lesions, ulcerations ?MSK:  No joint pain or swelling.  No decreased range of motion.  No back pain. ?Neuro: No dizziness or lightheadedness.  ?Psych: No depression or anxiety. Mood stable.  ? ? ? ?Physical Exam: ? ?BP 126/86 (BP Location: Right Arm, Patient Position: Sitting, Cuff Size: Normal)   Pulse 72   Temp 98 ?F (36.7 ?C) (Oral)   Ht 4\' 11"  (1.499 m)   Wt 179 lb 6.4 oz (81.4 kg)   SpO2 98%   BMI 36.23 kg/m?  ? ?GEN: Pleasant, interactive, well-appearing; obese; in no acute distress. ?HEENT:  Normocephalic and atraumatic. EACs patent bilaterally. TM pearly gray with present light reflex bilaterally. PERRLA. Sclera white. Nasal turbinates erythematous, moist and patent bilaterally.  White rhinorrhea present. Oropharynx erythematous and moist, without exudate or edema. No lesions, ulcerations ?NECK:  Supple w/ fair ROM. No JVD present. Normal carotid impulses w/o bruits. Thyroid symmetrical with no goiter or nodules palpated. No lymphadenopathy.   ?CV: RRR, no m/r/g, no peripheral edema. Pulses intact, +2 bilaterally. No cyanosis, pallor or clubbing. ?PULMONARY:  Unlabored, regular breathing.  Minimal end expiratory wheezes bilaterally A&P. No accessory muscle use. No dullness to percussion. ?GI: BS present and normoactive. Soft, non-tender to palpation. No organomegaly or masses detected. No CVA tenderness. ?MSK: No erythema, warmth or tenderness. Cap refil <2 sec all extrem. No deformities or joint swelling noted.  ?Neuro: A/Ox3. No focal deficits noted.   ?Skin: Warm, no lesions or rashe ?Psych: Normal affect and behavior. Judgement and thought content appropriate.  ? ? ? ?Lab Results: ? ?CBC ?   ?Component Value Date/Time  ? WBC 8.0 03/24/2021 1741  ? RBC 5.52 (H) 03/24/2021 1741  ? HGB 15.8 (H) 03/24/2021 1741  ? HGB 14.5 06/19/2019 1606  ? HCT 48.5 (H) 03/24/2021 1741  ?  HCT 43.4 06/19/2019 1606  ? PLT 269 03/24/2021 1741  ? PLT 260 06/19/2019 1606  ? MCV 87.9 03/24/2021 1741  ? MCV 89 06/19/2019 1606  ? MCH 28.6 03/24/2021 1741  ? MCHC 32.6 03/24/2021 1741  ? RDW 13.8 03/25/19

## 2022-04-22 NOTE — Assessment & Plan Note (Addendum)
Acute flare in symptoms with bronchitic sounding cough.  Has not had any recent flares requiring prednisone or antibiotics. Suspect there is an upper airway component as well. Will treat with doxycycline course and prednisone burst.  CXR to rule out acute process.  Continue triple therapy with Breztri.  Has tried Mucinex DM without much relief in cough.  Advised her to continue.  We will add on Tessalon Perles and Phenergan with codeine for cough control.  Check CBC with differential and IgE; may add Singulair back on depending on labs. ? ?Patient Instructions  ?Continue Breztri 2 puffs twice daily.  Brush tongue, rinse mouth and gargle afterwards ?Continue Albuterol inhaler 2 puffs every 6 hours as needed for shortness of breath or wheezing. Notify if symptoms persist despite rescue inhaler/neb use. ?Continue Flonase nasal spray 2 sprays each nostril twice daily ?Continue Zyrtec 10 mg daily for allergies  ?Continue Mucinex DM 600 mg Twice daily for cough/congestion ? ?-Astelin 2 sprays each nostril Twice daily for nasal congestion/drainage  ?-Tessalon perles 1 capsule Three times a day for cough  ?-Phenergan with codeine cough syrup 5 mL every 6 hours as needed for cough ?-Doxycycline 1 tab twice daily for 7 days.  Take with foo ?-Prednisone 40 mg (2 tabs) daily for 5 days. Take in AM with food.  ? ?Labs today-CBC with differential, IgE ? ?Chest x-ray today ? ?Follow up in one month with Dr. Judeth Horn. If symptoms do not improve or worsen, please contact office for sooner follow up or seek emergency care. ? ? ?

## 2022-04-22 NOTE — Addendum Note (Signed)
Addended by: Fran Lowes on: 04/22/2022 02:15 PM ? ? Modules accepted: Orders ? ?

## 2022-04-22 NOTE — Progress Notes (Signed)
Please notify pt chest x ray was clear. Continue with our plan as discussed earlier. Thanks.

## 2022-04-25 LAB — IGE: IgE (Immunoglobulin E), Serum: 34 kU/L (ref <=114)

## 2022-05-10 DIAGNOSIS — N764 Abscess of vulva: Secondary | ICD-10-CM | POA: Diagnosis not present

## 2022-05-10 DIAGNOSIS — Z0000 Encounter for general adult medical examination without abnormal findings: Secondary | ICD-10-CM | POA: Diagnosis not present

## 2022-05-10 DIAGNOSIS — N898 Other specified noninflammatory disorders of vagina: Secondary | ICD-10-CM | POA: Diagnosis not present

## 2022-05-10 DIAGNOSIS — Z124 Encounter for screening for malignant neoplasm of cervix: Secondary | ICD-10-CM | POA: Diagnosis not present

## 2022-05-10 DIAGNOSIS — Z01419 Encounter for gynecological examination (general) (routine) without abnormal findings: Secondary | ICD-10-CM | POA: Diagnosis not present

## 2022-06-02 DIAGNOSIS — F330 Major depressive disorder, recurrent, mild: Secondary | ICD-10-CM | POA: Diagnosis not present

## 2022-06-02 DIAGNOSIS — F419 Anxiety disorder, unspecified: Secondary | ICD-10-CM | POA: Diagnosis not present

## 2022-06-16 DIAGNOSIS — Z801 Family history of malignant neoplasm of trachea, bronchus and lung: Secondary | ICD-10-CM | POA: Diagnosis not present

## 2022-06-16 DIAGNOSIS — Z8042 Family history of malignant neoplasm of prostate: Secondary | ICD-10-CM | POA: Diagnosis not present

## 2022-06-16 DIAGNOSIS — Z1231 Encounter for screening mammogram for malignant neoplasm of breast: Secondary | ICD-10-CM | POA: Diagnosis not present

## 2022-06-16 DIAGNOSIS — Z808 Family history of malignant neoplasm of other organs or systems: Secondary | ICD-10-CM | POA: Diagnosis not present

## 2022-06-16 DIAGNOSIS — Z800 Family history of malignant neoplasm of digestive organs: Secondary | ICD-10-CM | POA: Diagnosis not present

## 2022-06-22 DIAGNOSIS — S39012A Strain of muscle, fascia and tendon of lower back, initial encounter: Secondary | ICD-10-CM | POA: Diagnosis not present

## 2022-07-14 DIAGNOSIS — Z09 Encounter for follow-up examination after completed treatment for conditions other than malignant neoplasm: Secondary | ICD-10-CM | POA: Diagnosis not present

## 2022-07-14 DIAGNOSIS — F419 Anxiety disorder, unspecified: Secondary | ICD-10-CM | POA: Diagnosis not present

## 2022-07-14 DIAGNOSIS — R928 Other abnormal and inconclusive findings on diagnostic imaging of breast: Secondary | ICD-10-CM | POA: Diagnosis not present

## 2022-07-14 DIAGNOSIS — F330 Major depressive disorder, recurrent, mild: Secondary | ICD-10-CM | POA: Diagnosis not present

## 2022-07-26 ENCOUNTER — Ambulatory Visit: Payer: BC Managed Care – PPO | Admitting: Internal Medicine

## 2022-08-09 DIAGNOSIS — F411 Generalized anxiety disorder: Secondary | ICD-10-CM | POA: Diagnosis not present

## 2022-08-09 DIAGNOSIS — F32A Depression, unspecified: Secondary | ICD-10-CM | POA: Diagnosis not present

## 2022-08-09 DIAGNOSIS — F419 Anxiety disorder, unspecified: Secondary | ICD-10-CM | POA: Diagnosis not present

## 2022-08-09 DIAGNOSIS — Z87898 Personal history of other specified conditions: Secondary | ICD-10-CM | POA: Diagnosis not present

## 2022-08-11 DIAGNOSIS — F330 Major depressive disorder, recurrent, mild: Secondary | ICD-10-CM | POA: Diagnosis not present

## 2022-08-11 DIAGNOSIS — E669 Obesity, unspecified: Secondary | ICD-10-CM | POA: Diagnosis not present

## 2022-08-11 DIAGNOSIS — F419 Anxiety disorder, unspecified: Secondary | ICD-10-CM | POA: Diagnosis not present

## 2022-08-11 DIAGNOSIS — U099 Post covid-19 condition, unspecified: Secondary | ICD-10-CM | POA: Diagnosis not present

## 2022-08-11 DIAGNOSIS — E789 Disorder of lipoprotein metabolism, unspecified: Secondary | ICD-10-CM | POA: Diagnosis not present

## 2022-08-11 DIAGNOSIS — G44009 Cluster headache syndrome, unspecified, not intractable: Secondary | ICD-10-CM | POA: Diagnosis not present

## 2022-08-15 DIAGNOSIS — E1165 Type 2 diabetes mellitus with hyperglycemia: Secondary | ICD-10-CM | POA: Diagnosis not present

## 2022-08-15 DIAGNOSIS — E1159 Type 2 diabetes mellitus with other circulatory complications: Secondary | ICD-10-CM | POA: Diagnosis not present

## 2022-08-15 DIAGNOSIS — E559 Vitamin D deficiency, unspecified: Secondary | ICD-10-CM | POA: Diagnosis not present

## 2022-08-15 DIAGNOSIS — R7989 Other specified abnormal findings of blood chemistry: Secondary | ICD-10-CM | POA: Diagnosis not present

## 2022-08-15 DIAGNOSIS — E782 Mixed hyperlipidemia: Secondary | ICD-10-CM | POA: Diagnosis not present

## 2022-08-23 DIAGNOSIS — E782 Mixed hyperlipidemia: Secondary | ICD-10-CM | POA: Diagnosis not present

## 2022-08-23 DIAGNOSIS — Z23 Encounter for immunization: Secondary | ICD-10-CM | POA: Diagnosis not present

## 2022-08-23 DIAGNOSIS — E1159 Type 2 diabetes mellitus with other circulatory complications: Secondary | ICD-10-CM | POA: Diagnosis not present

## 2022-08-23 DIAGNOSIS — E1165 Type 2 diabetes mellitus with hyperglycemia: Secondary | ICD-10-CM | POA: Diagnosis not present

## 2022-09-19 ENCOUNTER — Institutional Professional Consult (permissible substitution): Payer: Self-pay | Admitting: Neurology

## 2022-09-21 DIAGNOSIS — K5900 Constipation, unspecified: Secondary | ICD-10-CM | POA: Diagnosis not present

## 2022-09-21 DIAGNOSIS — I10 Essential (primary) hypertension: Secondary | ICD-10-CM | POA: Diagnosis not present

## 2022-09-21 DIAGNOSIS — Z23 Encounter for immunization: Secondary | ICD-10-CM | POA: Diagnosis not present

## 2022-09-21 DIAGNOSIS — E1165 Type 2 diabetes mellitus with hyperglycemia: Secondary | ICD-10-CM | POA: Diagnosis not present

## 2022-09-22 DIAGNOSIS — E119 Type 2 diabetes mellitus without complications: Secondary | ICD-10-CM | POA: Diagnosis not present

## 2022-10-04 DIAGNOSIS — F419 Anxiety disorder, unspecified: Secondary | ICD-10-CM | POA: Diagnosis not present

## 2022-10-04 DIAGNOSIS — F330 Major depressive disorder, recurrent, mild: Secondary | ICD-10-CM | POA: Diagnosis not present

## 2022-10-14 DIAGNOSIS — G43909 Migraine, unspecified, not intractable, without status migrainosus: Secondary | ICD-10-CM | POA: Diagnosis not present

## 2022-10-14 DIAGNOSIS — R519 Headache, unspecified: Secondary | ICD-10-CM | POA: Diagnosis not present

## 2022-10-14 DIAGNOSIS — G43109 Migraine with aura, not intractable, without status migrainosus: Secondary | ICD-10-CM | POA: Diagnosis not present

## 2022-10-20 ENCOUNTER — Encounter (HOSPITAL_BASED_OUTPATIENT_CLINIC_OR_DEPARTMENT_OTHER): Payer: Self-pay

## 2022-10-20 ENCOUNTER — Other Ambulatory Visit: Payer: Self-pay

## 2022-10-20 DIAGNOSIS — G43909 Migraine, unspecified, not intractable, without status migrainosus: Secondary | ICD-10-CM | POA: Diagnosis not present

## 2022-10-20 DIAGNOSIS — R519 Headache, unspecified: Secondary | ICD-10-CM | POA: Diagnosis not present

## 2022-10-20 DIAGNOSIS — G43109 Migraine with aura, not intractable, without status migrainosus: Secondary | ICD-10-CM | POA: Diagnosis not present

## 2022-10-20 NOTE — ED Triage Notes (Signed)
Pt has hx of migraines; went to doctors today and got new rx's for Nurtec, benadryl, and steroids. Pt reports migraine HA x1 week. Medications not helping. Pt has had intermittent nausea.

## 2022-10-21 ENCOUNTER — Emergency Department (HOSPITAL_BASED_OUTPATIENT_CLINIC_OR_DEPARTMENT_OTHER): Payer: BC Managed Care – PPO

## 2022-10-21 ENCOUNTER — Other Ambulatory Visit: Payer: Self-pay

## 2022-10-21 ENCOUNTER — Emergency Department (HOSPITAL_BASED_OUTPATIENT_CLINIC_OR_DEPARTMENT_OTHER)
Admission: EM | Admit: 2022-10-21 | Discharge: 2022-10-21 | Disposition: A | Discharge status: Home / Self Care | Payer: BC Managed Care – PPO | Attending: Emergency Medicine | Admitting: Emergency Medicine

## 2022-10-21 DIAGNOSIS — G43909 Migraine, unspecified, not intractable, without status migrainosus: Secondary | ICD-10-CM

## 2022-10-21 DIAGNOSIS — G44009 Cluster headache syndrome, unspecified, not intractable: Secondary | ICD-10-CM | POA: Diagnosis not present

## 2022-10-21 MED ORDER — KETOROLAC TROMETHAMINE 30 MG/ML IJ SOLN
15.0000 mg | Freq: Once | INTRAMUSCULAR | Status: AC
  Administered 2022-10-21: 15 mg via INTRAVENOUS
  Filled 2022-10-21: qty 1

## 2022-10-21 MED ORDER — MAGNESIUM SULFATE 2 GM/50ML IV SOLN
2.0000 g | Freq: Once | INTRAVENOUS | Status: AC
  Administered 2022-10-21: 2 g via INTRAVENOUS
  Filled 2022-10-21: qty 50

## 2022-10-21 MED ORDER — SODIUM CHLORIDE 0.9 % IV BOLUS
500.0000 mL | Freq: Once | INTRAVENOUS | Status: AC
  Administered 2022-10-21: 500 mL via INTRAVENOUS

## 2022-10-21 MED ORDER — MAGNESIUM SULFATE 50 % IJ SOLN: 2.0000 g | Freq: Once | INTRAMUSCULAR | Status: DC

## 2022-10-21 MED ORDER — DIPHENHYDRAMINE HCL 50 MG/ML IJ SOLN
12.5000 mg | Freq: Once | INTRAMUSCULAR | Status: AC
  Administered 2022-10-21: 12.5 mg via INTRAVENOUS
  Filled 2022-10-21: qty 1

## 2022-10-21 MED ORDER — METOCLOPRAMIDE HCL 5 MG/ML IJ SOLN
10.0000 mg | Freq: Once | INTRAMUSCULAR | Status: AC
  Administered 2022-10-21: 10 mg via INTRAVENOUS
  Filled 2022-10-21: qty 2

## 2022-10-21 NOTE — ED Notes (Signed)
Pt is sitting in a chair in the room. She states she is more comfortable,

## 2022-10-21 NOTE — ED Provider Notes (Signed)
[facility] EMERGENCY DEPARTMENT Provider Note   CSN: [id number] Arrival date & time: 10/20/22  2110     History  Chief Complaint  Patient presents with   Migraine    [name] is a 56 y.o. female.  The history is provided by the patient.  Migraine This is a recurrent problem. The current episode started more than 1 week ago (8 days). The problem occurs constantly. The problem has not changed since onset.Associated symptoms include headaches. Pertinent negatives include no chest pain, no abdominal pain and no shortness of breath. Nothing aggravates the symptoms. Nothing relieves the symptoms. Treatments tried: sumitritan which was helping for several days then stopped and today started steroids and Nurtec this evening. The treatment provided mild relief.  Patient with Migraines who started with a migraine 8 days ago but her neurologist was out of town and saw her PMD who restarted her sumatriptan.  This helped for several days but then stopped and was seen today and prescribed prednisone and nurtec.       Home Medications Prior to Admission medications   Medication Sig Start Date End Date Taking? Authorizing Provider  albuterol (PROAIR HFA) 108 (90 Base) MCG/ACT inhaler INHALE 2 PUFFS INTO THE LUNGS EVERY 6 HOURS AS NEEDED FOR WHEEZING OR SHORTNESS OF BREATH 02/26/20   Ellamae Sia, DO  azelastine (ASTELIN) 0.1 % nasal spray Place 2 sprays into both nostrils 2 (two) times daily. Use in each nostril as directed 04/22/22   Cobb, Ruby Cola, NP  benzonatate (TESSALON) 200 MG capsule Take 1 capsule (200 mg total) by mouth 3 (three) times daily as needed for cough. 04/22/22   Cobb, Ruby Cola, NP  BREZTRI AEROSPHERE 160-9-4.8 MCG/ACT AERO INHALE 2 PUFFS INTO THE LUNGS IN THE MORNING AND AT BEDTIME 04/22/22   Cobb, Ruby Cola, NP  Budeson-Glycopyrrol-Formoterol (BREZTRI AEROSPHERE) 160-9-4.8 MCG/ACT AERO Inhale 2 puffs into the lungs 2 (two) times daily. 01/20/21   Hunsucker,  Lesia Sago, MD  EPINEPHrine 0.3 mg/0.3 mL IJ SOAJ injection Inject 0.3 mg into the muscle as needed for anaphylaxis. 01/08/21   Ellamae Sia, DO  fluticasone (FLONASE) 50 MCG/ACT nasal spray Place 2 sprays into both nostrils 2 (two) times a day.    [provider]  levalbuterol (XOPENEX) 1.25 MG/3ML nebulizer solution USE 1 VIAL VIA NEBULIZER EVERY 6 HOURS AS NEEDED FOR WHEEZING OR SHORTNESS OF BREATH AND COUGH 01/15/21   Ellamae Sia, DO  Magnesium Gluconate 500 (27 Mg) MG TABS Take by mouth. Patient not taking: Reported on 04/22/2022    [provider]  montelukast (SINGULAIR) 10 MG tablet TAKE 1 TABLET(10 MG) BY MOUTH AT BEDTIME Patient not taking: Reported on 04/22/2022 12/21/20   [place], Betty G, MD  promethazine-codeine [facility] WITH CODEINE) 6.25-10 MG/5ML syrup Take 5 mLs by mouth every 6 (six) hours as needed for cough. 04/22/22   Cobb, Ruby Cola, NP  Respiratory Therapy Supplies (ONE FLOW SPIROMETER) DEVI 1 Device by Does not apply route 3 (three) times daily as needed. 01/29/20   [place], Betty G, MD  sertraline (ZOLOFT) 25 MG tablet TAKE 1 TABLET(25 MG) BY MOUTH DAILY 04/12/21   [place], Betty G, MD  SUMAtriptan (IMITREX) 50 MG tablet Take 50 mg by mouth every 2 (two) hours as needed for migraine. May repeat in 2 hours if headache persists or recurs.    [provider]      Allergies    Bee venom, Topiramate, and Vicodin [hydrocodone-acetaminophen]  Review of Systems   Review of Systems  Constitutional:  Negative for fatigue and fever.  HENT:  Negative for facial swelling and sore throat.   Eyes:  Negative for redness.  Respiratory:  Negative for shortness of breath.   Cardiovascular:  Negative for chest pain.  Gastrointestinal:  Positive for nausea and vomiting. Negative for abdominal pain.  Musculoskeletal:  Negative for neck pain and neck stiffness.  Skin:  Negative for rash.  Neurological:  Positive for headaches.  All other systems reviewed and are  negative.   Physical Exam Updated Vital Signs BP 99/64   Pulse 78   Temp 98 F (36.7 C) (Oral)   Resp 14   Ht 4\' 11"  (1.499 m)   Wt 80.5 kg   SpO2 91%   BMI 35.83 kg/m  Physical Exam Vitals and nursing note reviewed.  Constitutional:      General: She is not in acute distress.    Appearance: Normal appearance. She is well-developed.  HENT:     Head: Normocephalic and atraumatic.     Nose: Nose normal.  Eyes:     Extraocular Movements: Extraocular movements intact.     Conjunctiva/sclera: Conjunctivae normal.     Pupils: Pupils are equal, round, and reactive to light.     Comments: Disk margins sharp   Cardiovascular:     Rate and Rhythm: Normal rate and regular rhythm.     Pulses: Normal pulses.     Heart sounds: Normal heart sounds.  Pulmonary:     Effort: Pulmonary effort is normal. No respiratory distress.     Breath sounds: Normal breath sounds.  Abdominal:     General: Bowel sounds are normal. There is no distension.     Palpations: Abdomen is soft.     Tenderness: There is no abdominal tenderness. There is no guarding or rebound.  Genitourinary:    Vagina: No vaginal discharge.  Musculoskeletal:        General: Normal range of motion.     Cervical back: Neck supple.  Skin:    General: Skin is warm and dry.     Capillary Refill: Capillary refill takes less than 2 seconds.     Findings: No erythema or rash.  Neurological:     General: No focal deficit present.     Mental Status: She is alert and oriented to person, place, and time.     Cranial Nerves: No cranial nerve deficit.     Sensory: No sensory deficit.     Motor: No weakness.     Deep Tendon Reflexes: Reflexes normal.  Psychiatric:        Mood and Affect: Mood normal.     ED Results / Procedures / Treatments   Labs (all labs ordered are listed, but only abnormal results are displayed) Labs Reviewed - No data to display  EKG None  Radiology CT Head Wo Contrast  Result Date:  10/21/2022 CLINICAL DATA:  Headaches for 1 week, initial encounter EXAM: CT HEAD WITHOUT CONTRAST TECHNIQUE: Contiguous axial images were obtained from the base of the skull through the vertex without intravenous contrast. RADIATION DOSE REDUCTION: This exam was performed according to the departmental dose-optimization program which includes automated exposure control, adjustment of the mA and/or kV according to patient size and/or use of iterative reconstruction technique. COMPARISON:  None Available. FINDINGS: Brain: No evidence of acute infarction, hemorrhage, hydrocephalus, extra-axial collection or mass lesion/mass effect. Vascular: No hyperdense vessel or unexpected calcification. Skull: Normal. Negative for fracture or  focal lesion. Sinuses/Orbits: No acute finding. Other: None. IMPRESSION: No acute intracranial abnormality noted. Electronically Signed   By: Inez Catalina M.D.   On: 10/21/2022 00:40    Procedures Procedures    Medications Ordered in ED Medications  ketorolac (TORADOL) 30 MG/ML injection 15 mg (15 mg Intravenous Given 10/21/22 0056)  metoCLOPramide (REGLAN) injection 10 mg (10 mg Intravenous Given 10/21/22 0100)  diphenhydrAMINE (BENADRYL) injection 12.5 mg (12.5 mg Intravenous Given 10/21/22 0056)  sodium chloride 0.9 % bolus 500 mL (500 mLs Intravenous New Bag/Given 10/21/22 0106)  magnesium sulfate IVPB 2 g 50 mL (2 g Intravenous New Bag/Given 10/21/22 0102)    ED Course/ Medical Decision Making/ A&P                           Medical Decision Making Patient with migraines who presents with 8 days of typical migraine, not responding completely to sumatriptan   Amount and/or Complexity of Data Reviewed Independent Historian: spouse    Details: See above  External Data Reviewed: notes.    Details: Previous notes reviewed  Radiology: ordered and independent interpretation performed.    Details: Normal head CT  Risk Prescription drug management. Risk Details: Well  appearing, typical migraine in typical location right face and head since 13, 8 days this week.  Negative head CT, normal exam.  Disks sharp, no proptosis.  Intact cognition.  EOMI.  I doubt cavernous sinus thrombosis at this time.  No signs of ICH on exam or CT.  Stable for discharge, pain free.  Strict return.      Final Clinical Impression(s) / ED Diagnoses Final diagnoses:  Migraine without status migrainosus, not intractable, unspecified migraine type   Return for intractable cough, coughing up blood, fevers > 100.4 unrelieved by medication, shortness of breath, intractable vomiting, chest pain, shortness of breath, weakness, numbness, changes in speech, facial asymmetry, abdominal pain, passing out, Inability to tolerate liquids or food, cough, altered mental status or any concerns. No signs of systemic illness or infection. The patient is nontoxic-appearing on exam and vital signs are within normal limits.  I have reviewed the triage vital signs and the nursing notes. Pertinent labs & imaging results that were available during my care of the patient were reviewed by me and considered in my medical decision making (see chart for details). After history, exam, and medical workup I feel the patient has been appropriately medically screened and is safe for discharge home. Pertinent diagnoses were discussed with the patient. Patient was given return precautions Rx / DC Orders ED Discharge Orders     None         Shruthi Northrup, MD 10/21/22 2482

## 2022-10-21 NOTE — ED Notes (Signed)
Pt transported to CT ?

## 2023-01-30 DIAGNOSIS — E1165 Type 2 diabetes mellitus with hyperglycemia: Secondary | ICD-10-CM | POA: Diagnosis not present

## 2023-01-30 DIAGNOSIS — Z6836 Body mass index (BMI) 36.0-36.9, adult: Secondary | ICD-10-CM | POA: Diagnosis not present

## 2023-01-30 DIAGNOSIS — E785 Hyperlipidemia, unspecified: Secondary | ICD-10-CM | POA: Diagnosis not present

## 2023-01-30 DIAGNOSIS — Z1159 Encounter for screening for other viral diseases: Secondary | ICD-10-CM | POA: Diagnosis not present

## 2023-01-30 DIAGNOSIS — E669 Obesity, unspecified: Secondary | ICD-10-CM | POA: Diagnosis not present

## 2023-01-30 DIAGNOSIS — U071 COVID-19: Secondary | ICD-10-CM | POA: Diagnosis not present

## 2023-01-30 DIAGNOSIS — R519 Headache, unspecified: Secondary | ICD-10-CM | POA: Diagnosis not present

## 2023-01-30 DIAGNOSIS — G43109 Migraine with aura, not intractable, without status migrainosus: Secondary | ICD-10-CM | POA: Diagnosis not present

## 2023-02-21 DIAGNOSIS — F330 Major depressive disorder, recurrent, mild: Secondary | ICD-10-CM | POA: Diagnosis not present

## 2023-02-21 DIAGNOSIS — F419 Anxiety disorder, unspecified: Secondary | ICD-10-CM | POA: Diagnosis not present

## 2023-03-08 DIAGNOSIS — U071 COVID-19: Secondary | ICD-10-CM | POA: Diagnosis not present

## 2023-03-08 DIAGNOSIS — J309 Allergic rhinitis, unspecified: Secondary | ICD-10-CM | POA: Diagnosis not present

## 2023-03-08 DIAGNOSIS — E782 Mixed hyperlipidemia: Secondary | ICD-10-CM | POA: Diagnosis not present

## 2023-03-08 DIAGNOSIS — J45909 Unspecified asthma, uncomplicated: Secondary | ICD-10-CM | POA: Diagnosis not present

## 2023-03-08 DIAGNOSIS — E1165 Type 2 diabetes mellitus with hyperglycemia: Secondary | ICD-10-CM | POA: Diagnosis not present

## 2023-03-17 DIAGNOSIS — Z13220 Encounter for screening for lipoid disorders: Secondary | ICD-10-CM | POA: Diagnosis not present

## 2023-03-17 DIAGNOSIS — Z114 Encounter for screening for human immunodeficiency virus [HIV]: Secondary | ICD-10-CM | POA: Diagnosis not present

## 2023-03-17 DIAGNOSIS — E782 Mixed hyperlipidemia: Secondary | ICD-10-CM | POA: Diagnosis not present

## 2023-03-17 DIAGNOSIS — Z0000 Encounter for general adult medical examination without abnormal findings: Secondary | ICD-10-CM | POA: Diagnosis not present

## 2023-03-21 DIAGNOSIS — M1711 Unilateral primary osteoarthritis, right knee: Secondary | ICD-10-CM | POA: Diagnosis not present

## 2023-03-24 DIAGNOSIS — E785 Hyperlipidemia, unspecified: Secondary | ICD-10-CM | POA: Diagnosis not present

## 2023-03-24 DIAGNOSIS — Z0000 Encounter for general adult medical examination without abnormal findings: Secondary | ICD-10-CM | POA: Diagnosis not present

## 2023-03-24 DIAGNOSIS — Z23 Encounter for immunization: Secondary | ICD-10-CM | POA: Diagnosis not present

## 2023-03-24 DIAGNOSIS — E1165 Type 2 diabetes mellitus with hyperglycemia: Secondary | ICD-10-CM | POA: Diagnosis not present

## 2023-04-11 DIAGNOSIS — J4540 Moderate persistent asthma, uncomplicated: Secondary | ICD-10-CM | POA: Diagnosis not present

## 2023-04-11 DIAGNOSIS — J45990 Exercise induced bronchospasm: Secondary | ICD-10-CM | POA: Diagnosis not present

## 2023-04-11 DIAGNOSIS — E1165 Type 2 diabetes mellitus with hyperglycemia: Secondary | ICD-10-CM | POA: Diagnosis not present

## 2023-04-11 DIAGNOSIS — K581 Irritable bowel syndrome with constipation: Secondary | ICD-10-CM | POA: Diagnosis not present

## 2023-04-11 DIAGNOSIS — J45901 Unspecified asthma with (acute) exacerbation: Secondary | ICD-10-CM | POA: Diagnosis not present

## 2023-04-11 DIAGNOSIS — K5900 Constipation, unspecified: Secondary | ICD-10-CM | POA: Diagnosis not present

## 2023-05-16 DIAGNOSIS — F330 Major depressive disorder, recurrent, mild: Secondary | ICD-10-CM | POA: Diagnosis not present

## 2023-05-16 DIAGNOSIS — F419 Anxiety disorder, unspecified: Secondary | ICD-10-CM | POA: Diagnosis not present

## 2023-06-07 DIAGNOSIS — G5601 Carpal tunnel syndrome, right upper limb: Secondary | ICD-10-CM | POA: Diagnosis not present

## 2023-06-12 DIAGNOSIS — E782 Mixed hyperlipidemia: Secondary | ICD-10-CM | POA: Diagnosis not present

## 2023-06-12 DIAGNOSIS — E559 Vitamin D deficiency, unspecified: Secondary | ICD-10-CM | POA: Diagnosis not present

## 2023-06-12 DIAGNOSIS — I10 Essential (primary) hypertension: Secondary | ICD-10-CM | POA: Diagnosis not present

## 2023-06-12 DIAGNOSIS — E1165 Type 2 diabetes mellitus with hyperglycemia: Secondary | ICD-10-CM | POA: Diagnosis not present

## 2023-06-15 DIAGNOSIS — Z789 Other specified health status: Secondary | ICD-10-CM | POA: Diagnosis not present

## 2023-06-15 DIAGNOSIS — E1159 Type 2 diabetes mellitus with other circulatory complications: Secondary | ICD-10-CM | POA: Diagnosis not present

## 2023-06-15 DIAGNOSIS — E782 Mixed hyperlipidemia: Secondary | ICD-10-CM | POA: Diagnosis not present

## 2023-06-15 DIAGNOSIS — E1121 Type 2 diabetes mellitus with diabetic nephropathy: Secondary | ICD-10-CM | POA: Diagnosis not present

## 2023-06-15 DIAGNOSIS — I152 Hypertension secondary to endocrine disorders: Secondary | ICD-10-CM | POA: Diagnosis not present

## 2023-06-15 DIAGNOSIS — E1165 Type 2 diabetes mellitus with hyperglycemia: Secondary | ICD-10-CM | POA: Diagnosis not present

## 2023-06-15 DIAGNOSIS — E559 Vitamin D deficiency, unspecified: Secondary | ICD-10-CM | POA: Diagnosis not present

## 2023-06-15 DIAGNOSIS — K581 Irritable bowel syndrome with constipation: Secondary | ICD-10-CM | POA: Diagnosis not present
